# Patient Record
Sex: Male | Born: 2000
Health system: Southern US, Community
[De-identification: ages and names within clinical notes are randomized; demographics above are authoritative.]

## PROBLEM LIST (undated history)

## (undated) DIAGNOSIS — T7840XA Allergy, unspecified, initial encounter: Secondary | ICD-10-CM

## (undated) DIAGNOSIS — F418 Other specified anxiety disorders: Secondary | ICD-10-CM

## (undated) HISTORY — DX: Other specified anxiety disorders: F41.8

## (undated) HISTORY — DX: Allergy, unspecified, initial encounter: T78.40XA

---
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
## 2018-01-01 ENCOUNTER — Ambulatory Visit (INDEPENDENT_AMBULATORY_CARE_PROVIDER_SITE_OTHER): Payer: 59 | Admitting: Pediatrics

## 2018-01-01 DIAGNOSIS — Z23 Encounter for immunization: Secondary | ICD-10-CM | POA: Diagnosis not present

## 2018-01-01 NOTE — Progress Notes (Signed)
MenB per orders. Indications, contraindications and side effects of vaccine/vaccines discussed with parent and parent verbally expressed understanding and also agreed with the administration of vaccine/vaccines as ordered above today.Handout (VIS) given for each vaccine at this visit.  

## 2018-10-31 ENCOUNTER — Other Ambulatory Visit: Payer: Self-pay

## 2018-10-31 ENCOUNTER — Ambulatory Visit (INDEPENDENT_AMBULATORY_CARE_PROVIDER_SITE_OTHER): Payer: 59 | Admitting: Pediatrics

## 2018-10-31 ENCOUNTER — Encounter: Payer: Self-pay | Admitting: Pediatrics

## 2018-10-31 VITALS — BP 110/74 | Ht 68.0 in | Wt 140.8 lb

## 2018-10-31 DIAGNOSIS — Z68.41 Body mass index (BMI) pediatric, 5th percentile to less than 85th percentile for age: Secondary | ICD-10-CM

## 2018-10-31 DIAGNOSIS — L708 Other acne: Secondary | ICD-10-CM

## 2018-10-31 DIAGNOSIS — Z0001 Encounter for general adult medical examination with abnormal findings: Secondary | ICD-10-CM | POA: Diagnosis not present

## 2018-10-31 DIAGNOSIS — Z00129 Encounter for routine child health examination without abnormal findings: Secondary | ICD-10-CM

## 2018-10-31 MED ORDER — TRIAMCINOLONE ACETONIDE 0.025 % EX OINT: 1.0000 "application " | TOPICAL_OINTMENT | Freq: Two times a day (BID) | CUTANEOUS | 6 refills | Status: AC

## 2018-10-31 MED ORDER — CLINDAMYCIN PHOSPHATE 1 % EX GEL: Freq: Two times a day (BID) | CUTANEOUS | 12 refills | Status: AC

## 2018-10-31 MED FILL — CLINDAMYCIN PH 1% GEL: 1 | 21 days supply | Qty: 30 | Fill #0

## 2018-10-31 MED FILL — TRIAMCINOLONE 0.025% OINT: 0.025 | 14 days supply | Qty: 30 | Fill #0

## 2018-10-31 NOTE — Patient Instructions (Signed)

## 2018-10-31 NOTE — Progress Notes (Signed)
  Adolescent Well Care Visit Kevin Lane is a 18 y.o. male who is here for well care.    PCP:  Marcha Solders, MD   History was provided by the patient.  Current Issues: Acne to face and back Hand eczema  Nutrition: Nutrition/Eating Behaviors: good Adequate calcium in diet?: yes Supplements/ Vitamins: yes  Exercise/ Media: Play any Sports?/ Exercise: yes Screen Time:  < 2 hours Media Rules or Monitoring?: yes  Sleep:  Sleep: 8-10 hours  Social Screening: Lives with:  parents Parental relations:  good Activities, Work, and Research officer, political party?: yes Concerns regarding behavior with peers?  no Stressors of note: no  Education:  School GradeLoss adjuster, chartered: doing well; no concerns School Behavior: doing well; no concerns  Menstruation:   No LMP for male patient.    Tobacco?  no Secondhand smoke exposure?  no Drugs/ETOH?  no  Sexually Active?  no     Safe at home, in school & in relationships?  Yes Safe to self?  Yes   Screenings: Patient has a dental home: yes  The patient completed the Rapid Assessment for Adolescent Preventive Services screening questionnaire and the following topics were identified as risk factors and discussed: healthy eating, exercise, seatbelt use, bullying, abuse/trauma, weapon use, tobacco use, marijuana use, drug use, condom use, birth control, sexuality, suicidality/self harm, mental health issues, social isolation, school problems, family problems and screen time    PHQ-9 completed and results indicated --no risk  Physical Exam:  Vitals:   10/31/18 0926  BP: 110/74  Weight: 140 lb 12.8 oz (63.9 kg)  Height: 5\' 8"  (1.727 m)   BP 110/74   Ht 5\' 8"  (1.727 m)   Wt 140 lb 12.8 oz (63.9 kg)   BMI 21.41 kg/m  Body mass index: body mass index is 21.41 kg/m. Blood pressure percentiles are not available for patients who are 18 years or older.   Hearing Screening   125Hz  250Hz  500Hz  1000Hz  2000Hz  3000Hz  4000Hz  6000Hz   8000Hz   Right ear:   20 20 20 20 20     Left ear:   20 20 20 20 20       Visual Acuity Screening   Right eye Left eye Both eyes  Without correction: 10/10 10/10   With correction:       General Appearance:   alert, oriented, no acute distress and well nourished  HENT: Normocephalic, no obvious abnormality, conjunctiva clear  Mouth:   Normal appearing teeth, no obvious discoloration, dental caries, or dental caps  Neck:   Supple; thyroid: no enlargement, symmetric, no tenderness/mass/nodules  Chest normal  Lungs:   Clear to auscultation bilaterally, normal work of breathing  Heart:   Regular rate and rhythm, S1 and S2 normal, no murmurs;   Abdomen:   Soft, non-tender, no mass, or organomegaly  GU normal male genitals, no testicular masses or hernia  Musculoskeletal:   Tone and strength strong and symmetrical, all extremities               Lymphatic:   No cervical adenopathy  Skin/Hair/Nails:   Skin warm, dry and intact, no rashes, no bruises or petechiae  Neurologic:   Strength, gait, and coordination normal and age-appropriate     Assessment and Plan:   Annual well check  Acne Eczema  BMI is appropriate for age  Hearing screening result:normal Vision screening result: normal    Return in about 1 year (around 10/31/2019).Marcha Solders, MD

## 2018-12-11 MED FILL — CLINDAMYCIN PH 1% GEL: 1 | 21 days supply | Qty: 30 | Fill #1

## 2018-12-23 ENCOUNTER — Ambulatory Visit (INDEPENDENT_AMBULATORY_CARE_PROVIDER_SITE_OTHER): Payer: 59 | Admitting: Sports Medicine

## 2018-12-23 ENCOUNTER — Other Ambulatory Visit: Payer: Self-pay

## 2018-12-23 DIAGNOSIS — G5621 Lesion of ulnar nerve, right upper limb: Secondary | ICD-10-CM | POA: Diagnosis not present

## 2018-12-23 DIAGNOSIS — G5620 Lesion of ulnar nerve, unspecified upper limb: Secondary | ICD-10-CM | POA: Insufficient documentation

## 2018-12-23 MED ORDER — MELOXICAM 15 MG PO TABS: ORAL_TABLET | ORAL | 0 refills | Status: DC

## 2018-12-23 MED FILL — MELOXICAM 15 MG TABLET: 15 | 40 days supply | Qty: 40 | Fill #0

## 2018-12-23 NOTE — Progress Notes (Signed)
     Subjective:  HPI: Kevin Lane is a 18 y.o. presenting to clinic today to discuss the following:  Bilateral Wrist Pain Patient is a 18y/o male with PMH of elbow pain presenting today for bilateral wrist pain that began about 9 weeks ago. 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 It started in his right hand and then also his left hand when he started using that more. It is better when he is not using the keyboard and/or mouse or drawing. It is also worse if he lays on it at night. He does not have any numbness or loss of sensation. He denies fever, chills, rash, swelling of the hands, or any discoloration. No falls or recent trauma to the arms or wrist.     ROS noted in HPI.    Social History   Tobacco Use  Smoking Status Never Smoker  Smokeless Tobacco Never Used    Objective: BP 102/78   Ht 5\' 8"  (1.727 m)   Wt 140 lb (63.5 kg)   BMI 21.29 kg/m  Vitals and nursing notes reviewed  Physical Exam Wrist, Bilateral: TTP noted diffusely in both hands and at the wrist joint. Inspection yielded no erythema, ecchymosis, bony deformity, or swelling. ROM full with good flexion and extension and ulnar/radial deviation that is symmetrical with opposite wrist. Palpation is normal over metacarpals, scaphoid, lunate, and TFCC although he does have mild diffuse tenderness in his hands; tendons without tenderness/swelling. Strength 5/5 in all directions without pain. Negative Finkelstein, tinel's and phalens.  Elbow, Bilateral:  Inspection yielded no erythema, ecchymosis, bony deformity, or swelling. ROM full with good flexion and extension that is symmetrical with opposite elbow. Palpation yielded no tenderness. Strength 5/5 in flexion and extension. Positive tinel sign at the cubital tunnel on the right, negative on the left.  Assessment/Plan:  Irritation of  right ulnar nerve Most likely given history and symptoms patient has ulnar nerve irritation bilaterally from overuse. More on the right than the left which makes sense given he is right handed and uses it more. It has responded to some mild activity modification. - Meloxicam 15mg  once daily for 7 days, then prn - Hand PT - Can continue bracing during activities that are known to cause pain/discomfort - F/u in 6 weeks if no improvement with PT and consider getting EMG   PATIENT EDUCATION PROVIDED: See AVS    Diagnosis and plan along with any newly prescribed medication(s) were discussed in detail with this patient today. The patient verbalized understanding and agreed with the plan. Patient advised if symptoms worsen return to clinic or ER.    Meds ordered this encounter  Medications  . meloxicam (MOBIC) 15 MG tablet    Sig: Take 1 tablet daily with food for 7 days. Then take as needed.    Dispense:  40 tablet    Refill:  0     Tim Garlan Fillers, DO 12/23/2018, 8:30 AM PGY-3 [facility]  Patient seen and evaluated with the resident.  I agree with the above plan of care.  Although not straightforward, patient's symptoms sound suspicious for bilateral ulnar neuritis.  He will try short course of anti-inflammatory medicine and start hand physical therapy.  Follow-up with me again in 6 weeks for reevaluation.  We could consider EMG/nerve conduction studies at that time if symptoms worsen.

## 2018-12-23 NOTE — Assessment & Plan Note (Deleted)
Most likely given history and symptoms patient has ulnar nerve entrapement/irritation bilaterally from overuse. More on the right than the left which makes sense given he is right handed and uses it more. It has responded to some mild activity modification. - Meloxicam 15mg  once daily for 7 days, then prn - Hand PT with [facility] - Can continue bracing during activities that are known to cause pain/discomfort - F/u in 6 weeks if no improvement with PT and consider getting EMG

## 2018-12-23 NOTE — Assessment & Plan Note (Signed)
Most likely given history and symptoms patient has ulnar nerve irritation bilaterally from overuse. More on the right than the left which makes sense given he is right handed and uses it more. It has responded to some mild activity modification. - Meloxicam 15mg  once daily for 7 days, then prn - Hand PT - Can continue bracing during activities that are known to cause pain/discomfort - F/u in 6 weeks if no improvement with PT and consider getting EMG

## 2018-12-23 NOTE — Patient Instructions (Signed)
Cubital Tunnel Syndrome  Cubital tunnel syndrome is a condition that causes pain and weakness of the forearm and hand. It happens when one of the nerves that runs along the inside of the elbow joint (ulnar nerve) becomes irritated. This condition is usually caused by repeated arm motions that are done during sports or work-related activities. What are the causes? This condition may be caused by:  Increased pressure on the ulnar nerve at the elbow, arm, or forearm. This can result from: ? Irritation caused by repeated elbow bending. ? Poorly healed elbow fractures. ? Tumors in the elbow. These are usually noncancerous (benign). ? Scar tissue that develops in the elbow after an injury. ? Bony growths (spurs) near the ulnar nerve.  Stretching of the nerve due to loose elbow ligaments.  Trauma to the nerve at the elbow. What increases the risk? The following factors may make you more likely to develop this condition:  Doing manual labor that requires frequent bending of the elbow.  Playing sports that include repeated or strenuous throwing motions, such as baseball.  Playing contact sports, such as football or lacrosse.  Not warming up properly before activities.  Having diabetes.  Having an underactive thyroid (hypothyroidism). What are the signs or symptoms? Symptoms of this condition include:  Clumsiness or weakness of the hand.  Tenderness of the inner elbow.  Aching or soreness of the inner elbow, forearm, or fingers, especially the little finger or the ring finger.  Increased pain when forcing the elbow to bend.  Reduced control when throwing objects.  Tingling, numbness, or a burning feeling inside the forearm or in part of the hand or fingers, especially the little finger or the ring finger.  Sharp pains that shoot from the elbow down to the wrist and hand.  The inability to grip or pinch hard. How is this diagnosed? This condition is diagnosed based on:  Your  symptoms and medical history. Your health care provider will also ask for details about any injury.  A physical exam. You may also have tests, including:  Electromyogram (EMG). This test measures electrical signals sent by your nerves into the muscles.  Nerve conduction study. This test measures how well electrical signals pass through your nerves.  Imaging tests, such as X-rays, ultrasound, and MRI. These tests check for possible causes of your condition. How is this treated? This condition may be treated by:  Stopping the activities that are causing your symptoms to get worse.  Icing and taking medicines to reduce pain and swelling.  Wearing a splint to prevent your elbow from bending, or wearing an elbow pad where the ulnar nerve is closest to the skin.  Working with a physical therapist in less severe cases. This may help to: ? Decrease your symptoms. ? Improve the strength and range of motion of your elbow, forearm, and hand. If these treatments do not help, surgery may be needed. Follow these instructions at home: If you have a splint:  Wear the splint as told by your health care provider. Remove it only as told by your health care provider.  Loosen the splint if your fingers tingle, become numb, or turn cold and blue.  Keep the splint clean.  If the splint is not waterproof: ? Do not let it get wet. ? Cover it with a watertight covering when you take a bath or shower. Managing pain, stiffness, and swelling   If directed, put ice on the injured area: ? Put ice in a plastic bag. ?   Place a towel between your skin and the bag. ? Leave the ice on for 20 minutes, 2-3 times a day.  Move your fingers often to avoid stiffness and to lessen swelling.  Raise (elevate) the injured area above the level of your heart while you are sitting or lying down. General instructions  Take over-the-counter and prescription medicines only as told by your health care provider.  Do any  exercise or physical therapy as told by your health care provider.  Do not drive or use heavy machinery while taking prescription pain medicine.  If you were given an elbow pad, wear it as told by your health care provider.  Keep all follow-up visits as told by your health care provider. This is important. Contact a health care provider if:  Your symptoms get worse.  Your symptoms do not get better with treatment.  You have new pain.  Your hand on the injured side feels numb or cold. Summary  Cubital tunnel syndrome is a condition that causes pain and weakness of the forearm and hand.  You are more likely to develop this condition if you do work or play sports that involve repeated arm movements.  This condition is often treated by stopping repetitive activities, applying ice, and using anti-inflammatory medicines.  In rare cases, surgery may be needed. This information is not intended to replace advice given to you by your health care provider. Make sure you discuss any questions you have with your health care provider. Document Released: 03/06/2005 Document Revised: 07/23/2017 Document Reviewed: 07/23/2017 Elsevier Patient Education  2020 [employer]  

## 2019-01-01 DIAGNOSIS — M6281 Muscle weakness (generalized): Secondary | ICD-10-CM | POA: Diagnosis not present

## 2019-01-01 DIAGNOSIS — G5623 Lesion of ulnar nerve, bilateral upper limbs: Secondary | ICD-10-CM | POA: Diagnosis not present

## 2019-01-01 DIAGNOSIS — M25549 Pain in joints of unspecified hand: Secondary | ICD-10-CM | POA: Diagnosis not present

## 2019-01-01 DIAGNOSIS — M25529 Pain in unspecified elbow: Secondary | ICD-10-CM | POA: Diagnosis not present

## 2019-01-01 DIAGNOSIS — M25619 Stiffness of unspecified shoulder, not elsewhere classified: Secondary | ICD-10-CM | POA: Diagnosis not present

## 2019-01-10 DIAGNOSIS — M25549 Pain in joints of unspecified hand: Secondary | ICD-10-CM | POA: Diagnosis not present

## 2019-01-10 DIAGNOSIS — M25619 Stiffness of unspecified shoulder, not elsewhere classified: Secondary | ICD-10-CM | POA: Diagnosis not present

## 2019-01-10 DIAGNOSIS — M25529 Pain in unspecified elbow: Secondary | ICD-10-CM | POA: Diagnosis not present

## 2019-01-10 DIAGNOSIS — M6281 Muscle weakness (generalized): Secondary | ICD-10-CM | POA: Diagnosis not present

## 2019-01-10 DIAGNOSIS — G5623 Lesion of ulnar nerve, bilateral upper limbs: Secondary | ICD-10-CM | POA: Diagnosis not present

## 2019-01-13 DIAGNOSIS — M25619 Stiffness of unspecified shoulder, not elsewhere classified: Secondary | ICD-10-CM | POA: Diagnosis not present

## 2019-01-13 DIAGNOSIS — M25529 Pain in unspecified elbow: Secondary | ICD-10-CM | POA: Diagnosis not present

## 2019-01-13 DIAGNOSIS — M6281 Muscle weakness (generalized): Secondary | ICD-10-CM | POA: Diagnosis not present

## 2019-01-13 DIAGNOSIS — M25549 Pain in joints of unspecified hand: Secondary | ICD-10-CM | POA: Diagnosis not present

## 2019-01-13 DIAGNOSIS — G5623 Lesion of ulnar nerve, bilateral upper limbs: Secondary | ICD-10-CM | POA: Diagnosis not present

## 2019-01-15 DIAGNOSIS — M6281 Muscle weakness (generalized): Secondary | ICD-10-CM | POA: Diagnosis not present

## 2019-01-15 DIAGNOSIS — M25549 Pain in joints of unspecified hand: Secondary | ICD-10-CM | POA: Diagnosis not present

## 2019-01-15 DIAGNOSIS — M25619 Stiffness of unspecified shoulder, not elsewhere classified: Secondary | ICD-10-CM | POA: Diagnosis not present

## 2019-01-15 DIAGNOSIS — M25529 Pain in unspecified elbow: Secondary | ICD-10-CM | POA: Diagnosis not present

## 2019-01-15 DIAGNOSIS — G5623 Lesion of ulnar nerve, bilateral upper limbs: Secondary | ICD-10-CM | POA: Diagnosis not present

## 2019-01-20 DIAGNOSIS — G5623 Lesion of ulnar nerve, bilateral upper limbs: Secondary | ICD-10-CM | POA: Diagnosis not present

## 2019-01-20 DIAGNOSIS — M6281 Muscle weakness (generalized): Secondary | ICD-10-CM | POA: Diagnosis not present

## 2019-01-20 DIAGNOSIS — M25529 Pain in unspecified elbow: Secondary | ICD-10-CM | POA: Diagnosis not present

## 2019-01-20 DIAGNOSIS — M25619 Stiffness of unspecified shoulder, not elsewhere classified: Secondary | ICD-10-CM | POA: Diagnosis not present

## 2019-01-20 DIAGNOSIS — M25549 Pain in joints of unspecified hand: Secondary | ICD-10-CM | POA: Diagnosis not present

## 2019-01-22 DIAGNOSIS — G5623 Lesion of ulnar nerve, bilateral upper limbs: Secondary | ICD-10-CM | POA: Diagnosis not present

## 2019-01-22 DIAGNOSIS — M25619 Stiffness of unspecified shoulder, not elsewhere classified: Secondary | ICD-10-CM | POA: Diagnosis not present

## 2019-01-22 DIAGNOSIS — M6281 Muscle weakness (generalized): Secondary | ICD-10-CM | POA: Diagnosis not present

## 2019-01-22 DIAGNOSIS — M25549 Pain in joints of unspecified hand: Secondary | ICD-10-CM | POA: Diagnosis not present

## 2019-01-22 DIAGNOSIS — M25529 Pain in unspecified elbow: Secondary | ICD-10-CM | POA: Diagnosis not present

## 2019-01-27 DIAGNOSIS — M25619 Stiffness of unspecified shoulder, not elsewhere classified: Secondary | ICD-10-CM | POA: Diagnosis not present

## 2019-01-27 DIAGNOSIS — M25529 Pain in unspecified elbow: Secondary | ICD-10-CM | POA: Diagnosis not present

## 2019-01-27 DIAGNOSIS — G5623 Lesion of ulnar nerve, bilateral upper limbs: Secondary | ICD-10-CM | POA: Diagnosis not present

## 2019-01-27 DIAGNOSIS — M6281 Muscle weakness (generalized): Secondary | ICD-10-CM | POA: Diagnosis not present

## 2019-01-27 DIAGNOSIS — M25549 Pain in joints of unspecified hand: Secondary | ICD-10-CM | POA: Diagnosis not present

## 2019-01-29 ENCOUNTER — Other Ambulatory Visit: Payer: Self-pay

## 2019-01-29 ENCOUNTER — Ambulatory Visit (INDEPENDENT_AMBULATORY_CARE_PROVIDER_SITE_OTHER): Payer: 59 | Admitting: Sports Medicine

## 2019-01-29 VITALS — BP 102/78 | Ht 68.0 in | Wt 140.0 lb

## 2019-01-29 DIAGNOSIS — G5621 Lesion of ulnar nerve, right upper limb: Secondary | ICD-10-CM | POA: Diagnosis not present

## 2019-01-29 NOTE — Progress Notes (Signed)
   Subjective:    Patient ID: Kevin Lane, male    DOB: 10/31/2000, 18 y.o.   MRN: [id number]  HPI   Patient comes in today for follow-up on ulnar neuritis in both arms, right greater than left.  While working with physical therapy the physical therapist noticed that his right ulnar nerve was subluxing from the cubital tunnel.  Patient is able to voluntarily sublux and dislocate his ulnar nerve which is quite uncomfortable.  He continues to endorse discomfort along the ulnar nerve distribution of his forearm and hand but he is also getting some more diffuse pain throughout the hand and wrist as well.    Review of Systems     Objective:   Physical Exam Well-developed, well-nourished.  No acute distress.  Awake alert and oriented x3.  Vital signs reviewed  Examination of the right elbow shows full range of motion with no effusion.  No soft tissue swelling.  There is palpable and visible evidence of ulnar nerve subluxation at the cubital tunnel.  Examination of the left elbow shows full range of motion but I cannot appreciate any voluntary dislocation on this side. I attempted a dynamic ultrasound of the ulnar nerve but it was difficult to capture the ulnar nerve in motion.       Assessment & Plan:   Bilateral ulnar neuritis, right greater than left, secondary to subluxing/dislocating ulnar nerves at the cubital tunnel  I would like for the patient to see Dr. Ophelia Charter.  I am going to defer any further work-up such as an MRI to his discretion.  My suspicion is that the patient will ultimately need an ulnar nerve transposition.  I will defer further treatment to the discretion of Dr. Griffin Basil and the patient will follow up with me as needed.

## 2019-01-29 NOTE — Patient Instructions (Addendum)
[facility] Dr Amedeo Plenty 03/06/19 [address] [place] [phone].

## 2019-01-30 MED FILL — CLINDAMYCIN PH 1% GEL: 1 | 21 days supply | Qty: 30 | Fill #2

## 2019-02-07 ENCOUNTER — Encounter: Payer: Self-pay | Admitting: Sports Medicine

## 2019-02-07 ENCOUNTER — Ambulatory Visit (INDEPENDENT_AMBULATORY_CARE_PROVIDER_SITE_OTHER): Payer: 59 | Admitting: Sports Medicine

## 2019-02-07 ENCOUNTER — Other Ambulatory Visit: Payer: Self-pay

## 2019-02-07 VITALS — BP 104/72 | Ht 68.0 in | Wt 140.0 lb

## 2019-02-07 DIAGNOSIS — M79672 Pain in left foot: Secondary | ICD-10-CM

## 2019-02-07 NOTE — Patient Instructions (Signed)
Is a small amount of fluid around the muscles that extend your toes Keep using the meloxicam daily Make sure your sneakers are not tight or too tight You can use vitamin B6 50 mg a day We have given you exercises to implement daily Follow-up with me in 1-3 weeks depending how you are feeling

## 2019-02-07 NOTE — Progress Notes (Addendum)
[facility] [address] [place], [place] [address] Phone: [phone] Fax: [phone]   Patient Name: Kevin Lane Date of Birth: 2000/09/24 Medical Record Number: [id number] Gender: male Date of Encounter: 02/07/2019  CC: Left foot pain  HPI: Patient is 18 year old male complaining of pain and tingling on the top of his left foot.  It started 1 week ago after going for a walk, and since that time any pressure such as a sock or shoe directly touching the top of his foot causes discomfort.  He is having to wear flip-flops.  Even the water hitting him in the shower is uncomfortable.  He denies any kind of injury to the ankle or foot.  He has never injured this ankle or foot before.  His mother is in the room and thinks it was a little more swollen compared to the other foot.  He denies radiating pain.  He has been using the same pair of shoes for 6 months.  Past Medical History:  Diagnosis Date  . Allergy   . Depression with anxiety April 2015    Current Outpatient Medications on File Prior to Visit  Medication Sig Dispense Refill  . clindamycin (CLINDAGEL) 1 % gel     . meloxicam (MOBIC) 15 MG tablet Take 1 tablet daily with food for 7 days. Then take as needed. 40 tablet 0  . EPIDUO FORTE 0.3-2.5 % GEL APPLY TO ACNE EVERY NIGHT AT BEDTIME AS DIRECTED  3  . minocycline (MINOCIN,DYNACIN) 50 MG capsule Take 50 mg by mouth 2 (two) times daily.  1   No current facility-administered medications on file prior to visit.     No past surgical history on file.  No Known Allergies  Social History   Socioeconomic History  . Marital status: Single    Spouse name: Not on file  . Number of children: Not on file  . Years of education: Not on file  . Highest education level: Not on file  Occupational History  . Not on file  Social Needs  . Financial resource strain: Not on file  . Food insecurity    Worry: Not on file    Inability: Not on  file  . Transportation needs    Medical: Not on file    Non-medical: Not on file  Tobacco Use  . Smoking status: Never Smoker  . Smokeless tobacco: Never Used  Substance and Sexual Activity  . Alcohol use: No  . Drug use: No  . Sexual activity: Never  Lifestyle  . Physical activity    Days per week: Not on file    Minutes per session: Not on file  . Stress: Not on file  Relationships  . Social Herbalist on phone: Not on file    Gets together: Not on file    Attends religious service: Not on file    Active member of club or organization: Not on file    Attends meetings of clubs or organizations: Not on file    Relationship status: Not on file  . Intimate partner violence    Fear of current or ex partner: Not on file    Emotionally abused: Not on file    Physically abused: Not on file    Forced sexual activity: Not on file  Other Topics Concern  . Not on file  Social History Narrative  . Not on file    No family history on file.  BP 104/72  Ht 5\' 8"  (1.727 m)   Wt 140 lb (63.5 kg)   BMI 21.29 kg/m   ROS:  See HPI CONST: no F/C, no malaise, no fatigue MSK: See above NEURO:  no weakness SKIN: no rash, no lesions HEME: no bleeding, no bruising, no erythema  Objective: GEN: Alert and oriented, NAD Pulm: Breathing unlabored PSY: normal mood, congruent affect  Left ankle No gross deformity, swelling, ecchymoses FROM Mild TTP anterior talus Negative ant drawer and talar tilt.   Negative syndesmotic compression. Thompsons test negative. NV intact distally.  Right ankle No gross deformity, swelling, ecchymoses FROM No TTP Negative ant drawer and talar tilt.   Negative syndesmotic compression. Thompsons test negative. NV intact distally.  Limited MSK ultrasound Left ankle Tibialis anterior tendon was visualized in long and short axis without abnormality.  Talar dome was visualized without any cortical irregularity or neovascularization.   Small amount of hypoechoic changes along extensor tendons of the foot.  Impression: Tendinitis of extensor tendons  Assessment and Plan:  1.  Left foot pain  Overall reassuring physical exam.  Recommended to loosen tightening of his laces.  He was given exercises for general tibialis anterior rehab.  Trial of vitamin B6.  Continue to use meloxicam.  He will follow-up in 1-3 weeks.   , DO, [facility] Sports Medicine Fellow  Addendum:  Patient seen in the office by fellow.  His history, exam, plan of care were precepted with me.  Judge Stall MD [facility]

## 2019-03-06 ENCOUNTER — Other Ambulatory Visit: Payer: Self-pay | Admitting: Sports Medicine

## 2019-03-06 DIAGNOSIS — G5623 Lesion of ulnar nerve, bilateral upper limbs: Secondary | ICD-10-CM | POA: Diagnosis not present

## 2019-03-06 DIAGNOSIS — M25522 Pain in left elbow: Secondary | ICD-10-CM | POA: Diagnosis not present

## 2019-03-06 DIAGNOSIS — M25521 Pain in right elbow: Secondary | ICD-10-CM | POA: Diagnosis not present

## 2019-03-06 MED FILL — METHYLPREDNISOLONE 4 MG TAB: 4 | 6 days supply | Qty: 21 | Fill #0

## 2019-03-06 MED FILL — CLINDAMYCIN PH 1% GEL: 1 | 21 days supply | Qty: 30 | Fill #3

## 2019-03-22 IMAGING — DX DG KNEE 3 VIEWS*L*
3 series · 3 of 3 positions shown · non-contrast
Comparison: None.

CLINICAL DATA: Chronic left knee pain with no injury.

EXAM:
LEFT KNEE - 3 VIEW

[dg knee 3 views left (1 of 3)]
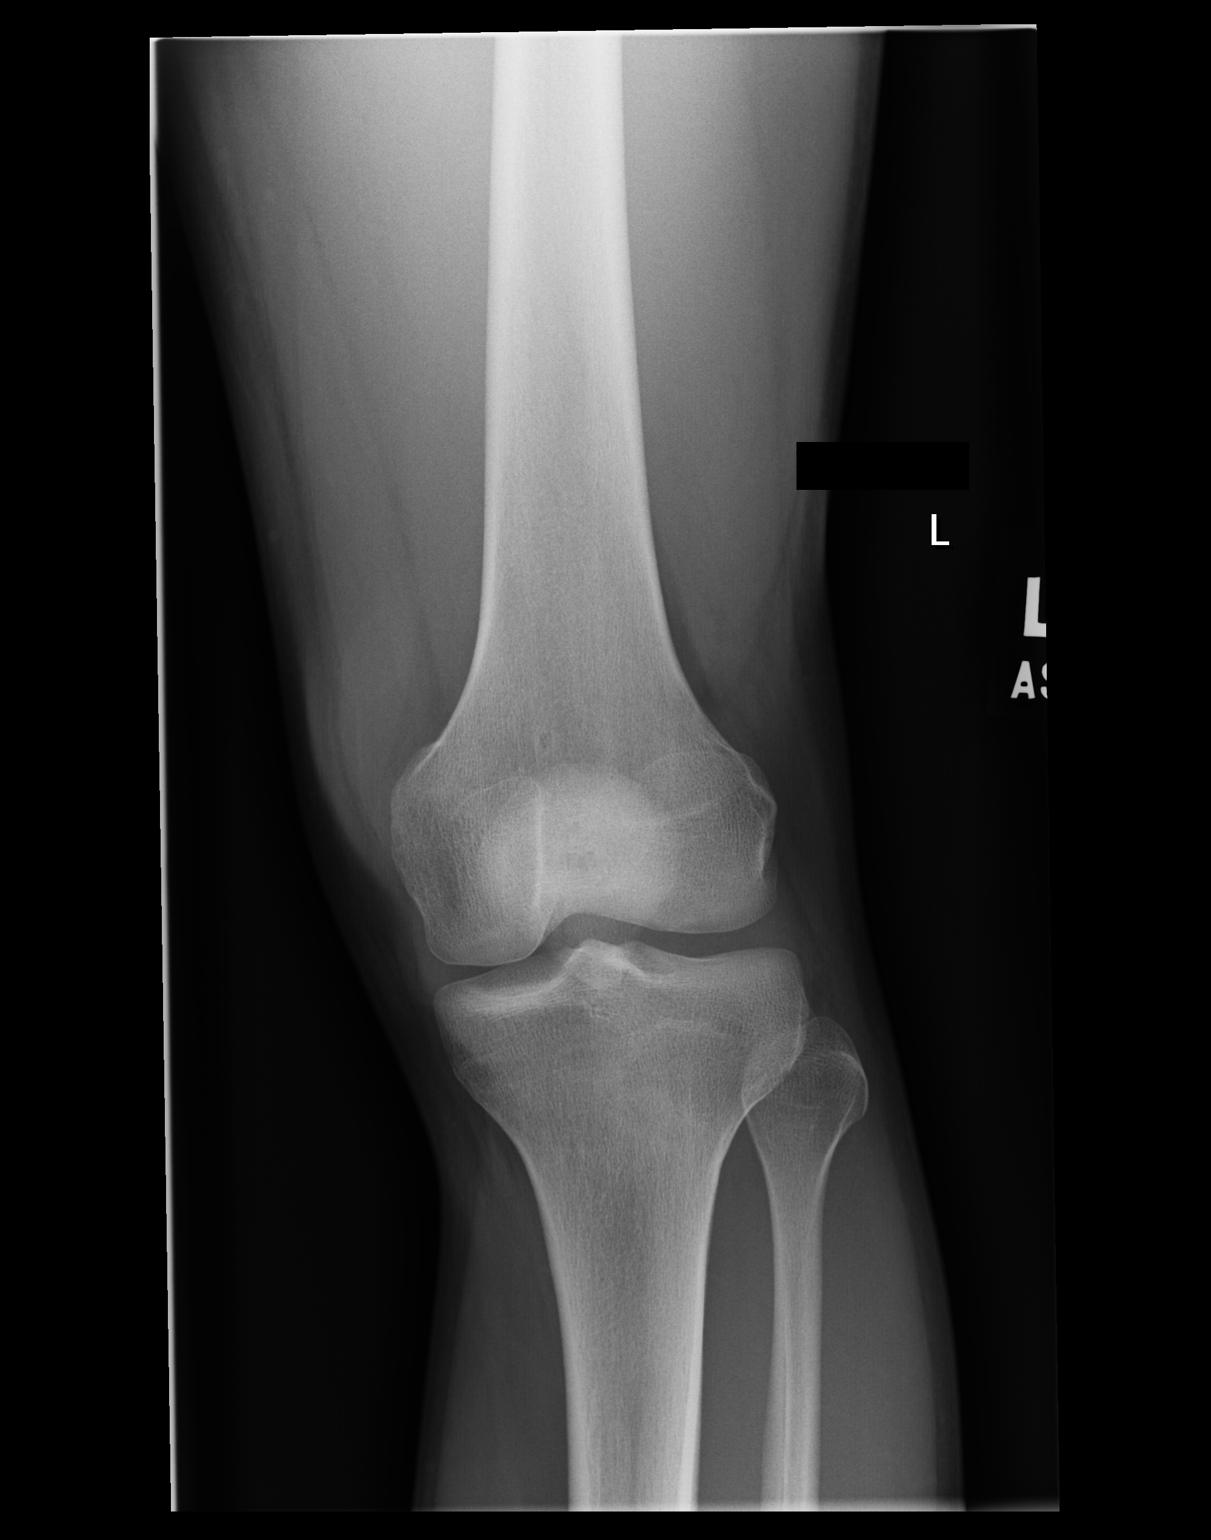

[dg knee 3 views left (2 of 3)]
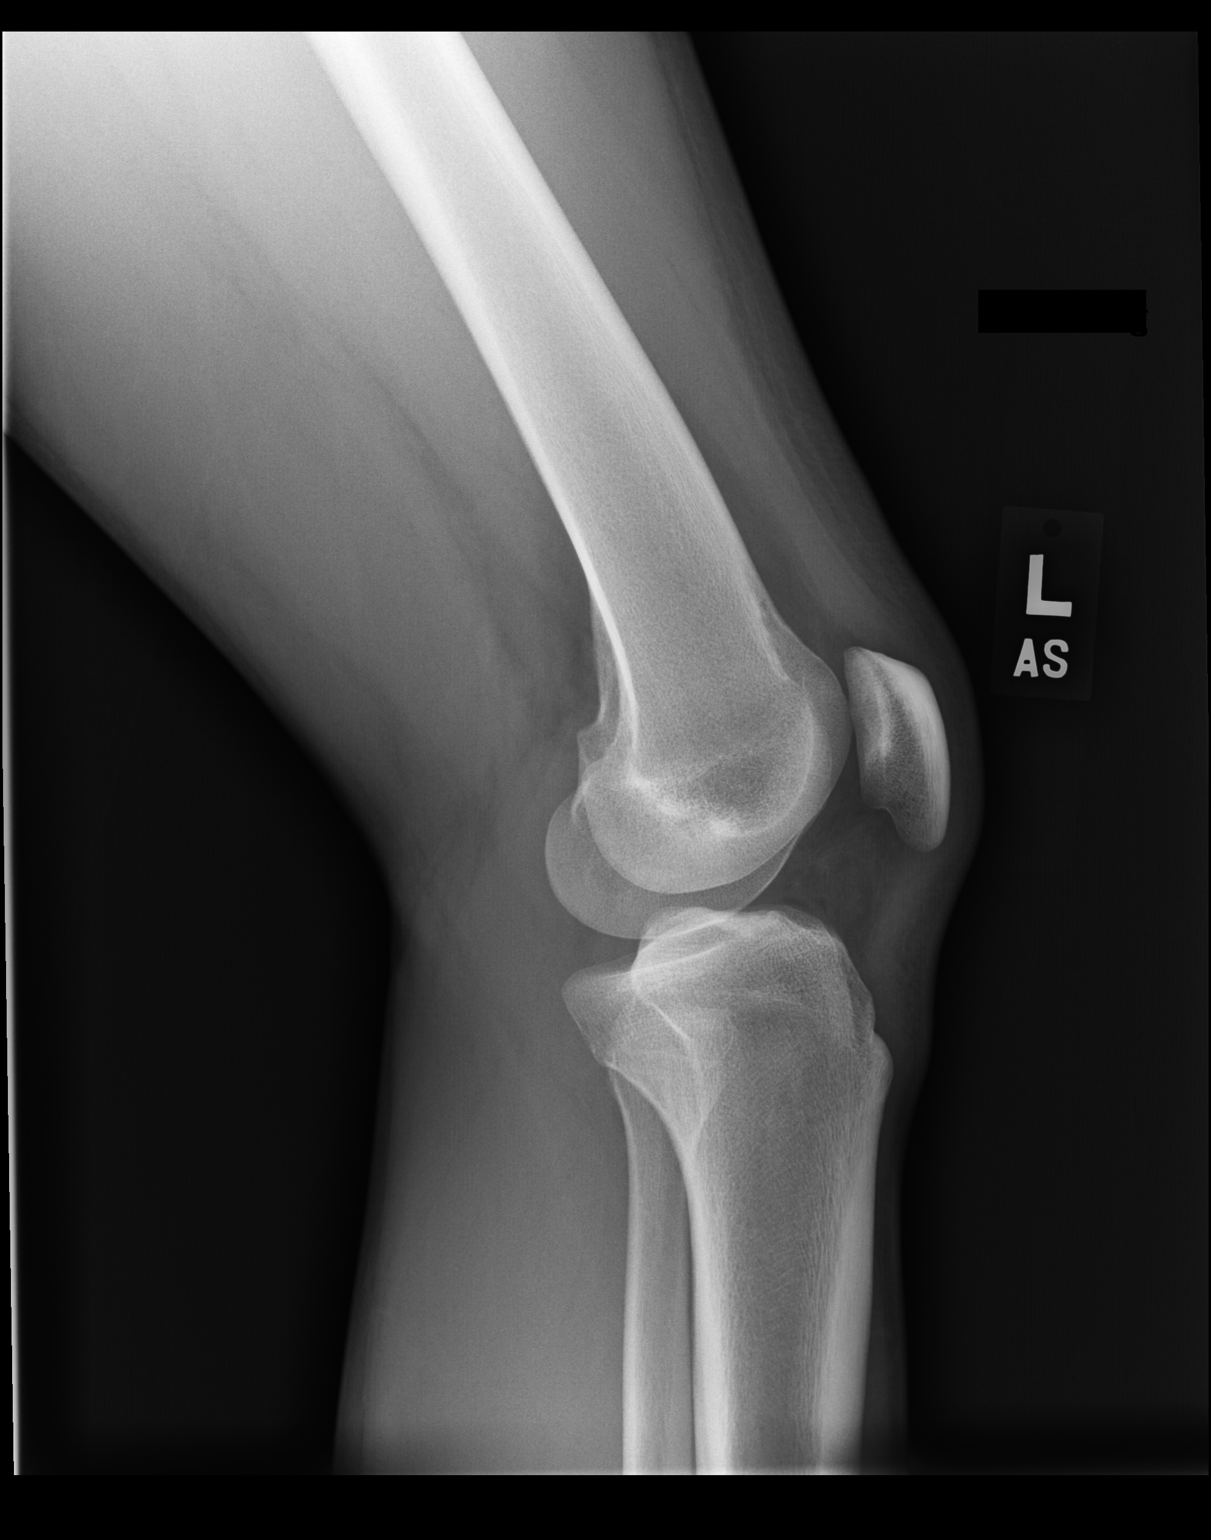

[dg knee 3 views left (3 of 3)]
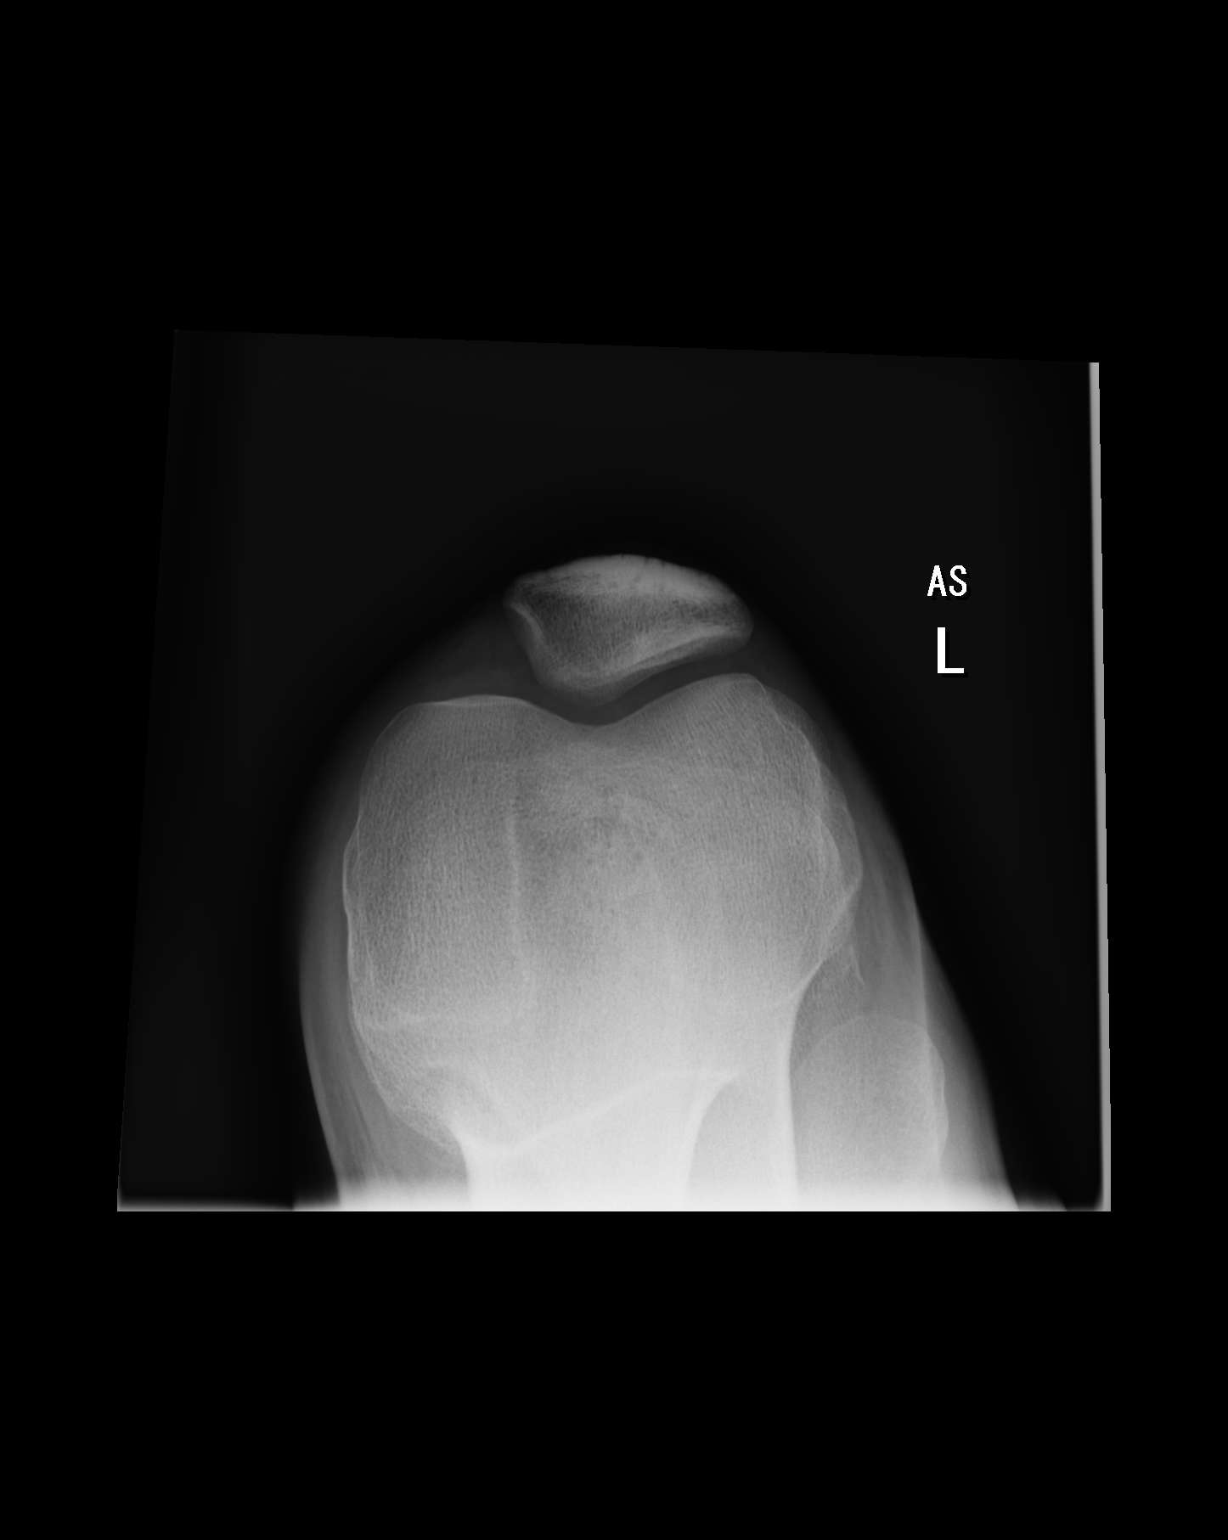

[3 of 3 positions shown; findings below may reference images not displayed]

FINDINGS: No evidence of fracture, dislocation, or joint effusion. No evidence
of arthropathy or other focal bone abnormality. Soft tissues are
unremarkable.
IMPRESSION: Negative.

## 2019-04-11 ENCOUNTER — Other Ambulatory Visit: Payer: Self-pay

## 2019-04-11 ENCOUNTER — Ambulatory Visit (HOSPITAL_COMMUNITY)
Admission: EM | Admit: 2019-04-11 | Discharge: 2019-04-11 | Disposition: A | Discharge status: Home / Self Care | Payer: 59 | Attending: Family Medicine | Admitting: Family Medicine

## 2019-04-11 ENCOUNTER — Encounter (HOSPITAL_COMMUNITY): Payer: Self-pay

## 2019-04-11 DIAGNOSIS — Z20828 Contact with and (suspected) exposure to other viral communicable diseases: Secondary | ICD-10-CM | POA: Diagnosis not present

## 2019-04-11 DIAGNOSIS — J029 Acute pharyngitis, unspecified: Secondary | ICD-10-CM | POA: Insufficient documentation

## 2019-04-11 LAB — POCT RAPID STREP A: Streptococcus, Group A Screen (Direct): NEGATIVE

## 2019-04-11 NOTE — Discharge Instructions (Addendum)
The strep test is negative Culture is pending This is likely a sore throat caused by a virus Covid testing is also pending You need to stay home and quarantine until your test result is available You may take Tylenol alternating with ibuprofen to reduce pain and fever You may try Chloraseptic spray or lozenges to reduce throat pain Salt water gargles are often helpful You can get your test results on MyChart

## 2019-04-11 NOTE — ED Triage Notes (Addendum)
Patient presents to Urgent Care with complaints of sore throat since two days ago, states swallowing feels sharp. Patient reports he also has been feeling achy.  Pt got a covid test pta, test pending.

## 2019-04-11 NOTE — ED Provider Notes (Signed)
[facility]    CSN: [id number] Arrival date & time: 04/11/19  1706      History   Chief Complaint Chief Complaint  Patient presents with  . Sore Throat    HPI Kevin Lane is a 19 y.o. male.   HPI  Patient has a severely painful throat for the last 3 days.  No fever chills.  No body aches.  No headache.  No loss of taste or smell.  Diminished appetite.  Not eating or drinking as much secondary to pain.  He is treating this with cough drops.  He got a Covid test this morning.  Will be available in 3 to 4 days.  Was done at [facility].  No known exposure to Covid.  Lives at home with family.   Past Medical History:  Diagnosis Date  . Allergy   . Depression with anxiety April 2015    Patient Active Problem List   Diagnosis Date Noted  . 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 Encounter for routine child health examination without abnormal findings 10/24/2016  . BMI (body mass index), pediatric, 5% to less than 85% for age 87/09/2013  . Depression with anxiety 06/24/2013  . Inflammatory acne 06/24/2013    History reviewed. No pertinent surgical history.     Home Medications    Prior to Admission medications   Medication Sig Start Date End Date Taking? Authorizing Provider  clindamycin (CLINDAGEL) 1 % gel  12/11/18   [provider]  EPIDUO FORTE 0.3-2.5 % GEL APPLY TO ACNE EVERY NIGHT AT BEDTIME AS DIRECTED 09/17/17   [provider]  meloxicam (MOBIC) 15 MG tablet Take 1 tablet daily with food for 7 days. Then take as needed. 12/23/18   Ralene Cork, DO  minocycline (MINOCIN,DYNACIN) 50 MG capsule Take 50 mg by mouth 2 (two) times daily. 09/17/17   [provider]    Family History Family History  Problem Relation Age of Onset  . Healthy Mother   . Hypertension Father     Social History Social History   Tobacco Use  . Smoking status: Never Smoker  . Smokeless  tobacco: Never Used  Substance Use Topics  . Alcohol use: No  . Drug use: No     Allergies   Patient has no known allergies.   Review of Systems Review of Systems  Constitutional: Positive for appetite change. Negative for chills, fatigue and fever.  HENT: Positive for sore throat and trouble swallowing. Negative for congestion.   Respiratory: Negative for cough and shortness of breath.   Gastrointestinal: Negative for abdominal pain and nausea.  Musculoskeletal: Positive for myalgias.  Neurological: Negative for dizziness and headaches.     Physical Exam Triage Vital Signs ED Triage Vitals  Enc Vitals Group     BP 04/11/19 1737 124/72     Pulse Rate 04/11/19 1737 83     Resp 04/11/19 1737 15     Temp 04/11/19 1737 98.2 F (36.8 C)     Temp Source 04/11/19 1737 Oral     SpO2 04/11/19 1737 99 %     Weight --      Height --      Head Circumference --      Peak Flow --      Pain Score 04/11/19 1735 7     Pain Loc --      Pain Edu? --  Excl. in GC? --    No data found.  Updated Vital Signs BP 124/72 (BP Location: Left Arm)   Pulse 83   Temp 98.2 F (36.8 C) (Oral)   Resp 15   SpO2 99%      Physical Exam Constitutional:      General: He is not in acute distress.    Appearance: He is well-developed. He is ill-appearing.     Comments: Appears tired  HENT:     Head: Normocephalic and atraumatic.     Right Ear: Tympanic membrane and ear canal normal.     Left Ear: Tympanic membrane and ear canal normal.     Ears:     Comments: Cerumen partially blocked both TMs    Nose: No congestion.     Mouth/Throat:     Mouth: Mucous membranes are moist.     Pharynx: Posterior oropharyngeal erythema present.     Tonsils: No tonsillar exudate. 1+ on the right. 1+ on the left.  Eyes:     Conjunctiva/sclera: Conjunctivae normal.     Pupils: Pupils are equal, round, and reactive to light.  Cardiovascular:     Rate and Rhythm: Normal rate and regular rhythm.      Heart sounds: Normal heart sounds.  Pulmonary:     Effort: Pulmonary effort is normal. No respiratory distress.     Breath sounds: Normal breath sounds.  Abdominal:     General: Bowel sounds are normal. There is no distension.     Palpations: Abdomen is soft.  Musculoskeletal:        General: Normal range of motion.     Cervical back: Normal range of motion.  Lymphadenopathy:     Cervical: Cervical adenopathy present.  Skin:    General: Skin is warm and dry.  Neurological:     General: No focal deficit present.     Mental Status: He is alert.  Psychiatric:        Mood and Affect: Mood normal.        Behavior: Behavior normal.      UC Treatments / Results  Labs (all labs ordered are listed, but only abnormal results are displayed) Labs Reviewed  CULTURE, GROUP A STREP [facility])  POCT RAPID STREP A   -Strep is negative.  Confirmation is sent to laboratory EKG   Radiology No results found.  Procedures Procedures (including critical care time)  Medications Ordered in UC Medications - No data to display  Initial Impression / Assessment and Plan / UC Course  I have reviewed the triage vital signs and the nursing notes.  Pertinent labs & imaging results that were available during my care of the patient were reviewed by me and considered in my medical decision making (see chart for details).     Reviewed with patient that he must quarantine until his test results are available. .  Reviewed symptomatic care sore throat Final Clinical Impressions(s) / UC Diagnoses   Final diagnoses:  Acute pharyngitis, unspecified etiology     Discharge Instructions     The strep test is negative Culture is pending This is likely a sore throat caused by a virus Covid testing is also pending You need to stay home and quarantine until your test result is available You may take Tylenol alternating with ibuprofen to reduce pain and fever You may try Chloraseptic spray or lozenges to  reduce throat pain Salt water gargles are often helpful You can get your test results on MyChart  ED Prescriptions    None     PDMP not reviewed this encounter.   Raylene Everts, MD 04/11/19 2126

## 2019-04-14 LAB — CULTURE, GROUP A STREP (THRC)

## 2019-06-28 ENCOUNTER — Ambulatory Visit: Payer: 59 | Attending: Internal Medicine

## 2019-06-28 DIAGNOSIS — Z23 Encounter for immunization: Secondary | ICD-10-CM

## 2019-06-28 NOTE — Progress Notes (Signed)
   Covid-19 Vaccination Clinic  Name:  Kevin Lane    MRN: [id number] DOB: [birth year]  06/28/2019  [name] was observed post Covid-19 immunization for 15 minutes without incident. He was provided with Vaccine Information Sheet and instruction to access the V-Safe system.   [name] was instructed to call 911 with any severe reactions post vaccine: Marland Kitchen Difficulty breathing  . Swelling of face and throat  . A fast heartbeat  . A bad rash all over body  . Dizziness and weakness   Immunizations Administered    Name Date Dose VIS Date Route   Pfizer COVID-19 Vaccine 06/28/2019 10:10 AM 0.3 mL 02/28/2019 Intramuscular   Manufacturer: [employer], [employer]   Lot: [id number]   NDC: [address]-0

## 2019-07-21 ENCOUNTER — Ambulatory Visit: Payer: 59

## 2019-07-23 ENCOUNTER — Ambulatory Visit: Payer: 59 | Attending: Internal Medicine

## 2019-07-23 DIAGNOSIS — Z23 Encounter for immunization: Secondary | ICD-10-CM

## 2019-07-23 NOTE — Progress Notes (Signed)
   Covid-19 Vaccination Clinic  Name:  Kevin Lane    MRN: [id number] DOB: Apr 27, 2000  07/23/2019  [name] was observed post Covid-19 immunization for 15 minutes without incident. He was provided with Vaccine Information Sheet and instruction to access the V-Safe system.   [name] was instructed to call 911 with any severe reactions post vaccine: Marland Kitchen Difficulty breathing  . Swelling of face and throat  . A fast heartbeat  . A bad rash all over body  . Dizziness and weakness   Immunizations Administered    Name Date Dose VIS Date Route   Pfizer COVID-19 Vaccine 07/23/2019 12:01 PM 0.3 mL 05/14/2018 Intramuscular   Manufacturer: [employer], [employer]   Lot: [id number]   NDC: [address]-5

## 2019-09-01 ENCOUNTER — Other Ambulatory Visit: Payer: Self-pay

## 2019-09-01 ENCOUNTER — Ambulatory Visit (INDEPENDENT_AMBULATORY_CARE_PROVIDER_SITE_OTHER): Payer: 59 | Admitting: Pediatrics

## 2019-09-01 VITALS — Wt 141.3 lb

## 2019-09-01 DIAGNOSIS — R350 Frequency of micturition: Secondary | ICD-10-CM

## 2019-09-01 DIAGNOSIS — N3289 Other specified disorders of bladder: Secondary | ICD-10-CM | POA: Diagnosis not present

## 2019-09-01 LAB — POCT URINALYSIS DIPSTICK
Bilirubin, UA: NEGATIVE
Blood, UA: NEGATIVE
Glucose, UA: NEGATIVE
Ketones, UA: NEGATIVE
Leukocytes, UA: NEGATIVE
Nitrite, UA: NEGATIVE
Protein, UA: NEGATIVE
Spec Grav, UA: 1.01 (ref 1.010–1.025)
Urobilinogen, UA: 0.2 E.U./dL
pH, UA: 7 (ref 5.0–8.0)

## 2019-09-03 ENCOUNTER — Encounter: Payer: Self-pay | Admitting: Pediatrics

## 2019-09-03 DIAGNOSIS — N3289 Other specified disorders of bladder: Secondary | ICD-10-CM | POA: Insufficient documentation

## 2019-09-03 DIAGNOSIS — R350 Frequency of micturition: Secondary | ICD-10-CM | POA: Insufficient documentation

## 2019-09-03 LAB — URINE CULTURE
MICRO NUMBER:: 10592830
Result:: NO GROWTH
SPECIMEN QUALITY:: ADEQUATE

## 2019-09-03 NOTE — Progress Notes (Signed)
Subjective:    Kevin Lane is a 19 y.o. male who complains of dysuria and frequency for a few days.  Patient also complains of stomach ache. Patient denies back pain, congestion, cough, fever and headache.  Patient does not have a history of recurrent UTI.  Patient does not have a history of pyelonephritis. The following portions of the patient's history were reviewed and updated as appropriate: allergies, current medications, past family history, past medical history, past social history, past surgical history and problem list. Review of Systems Pertinent items are noted in HPI.    Objective:    Wt 141 lb 4.8 oz (64.1 kg)   BMI 21.48 kg/m  General: alert, cooperative and no distress  Abdomen: soft, non-tender, without masses or organomegaly in the lower abdomen  Back: back muscles are full ROM  GU: defer exam   Laboratory:  Urine dipstick shows negative for all components.   Micro exam: negative for WBCs or RBCs.    Assessment:    Genital irritation    Plan: Plan:    1. Medications: not indicated at this time 2. Maintain adequate hydration 3. Follow up if symptoms not improving, and prn.

## 2019-09-03 NOTE — Patient Instructions (Signed)

## 2019-10-11 IMAGING — DX DG THORACIC SPINE 2V
3 series · 3 of 3 positions shown · non-contrast
Comparison: No recent.

CLINICAL DATA: Chronic scapular pain.  No injury.

EXAM:
THORACIC SPINE 2 VIEWS

[t thoracic spine ap]
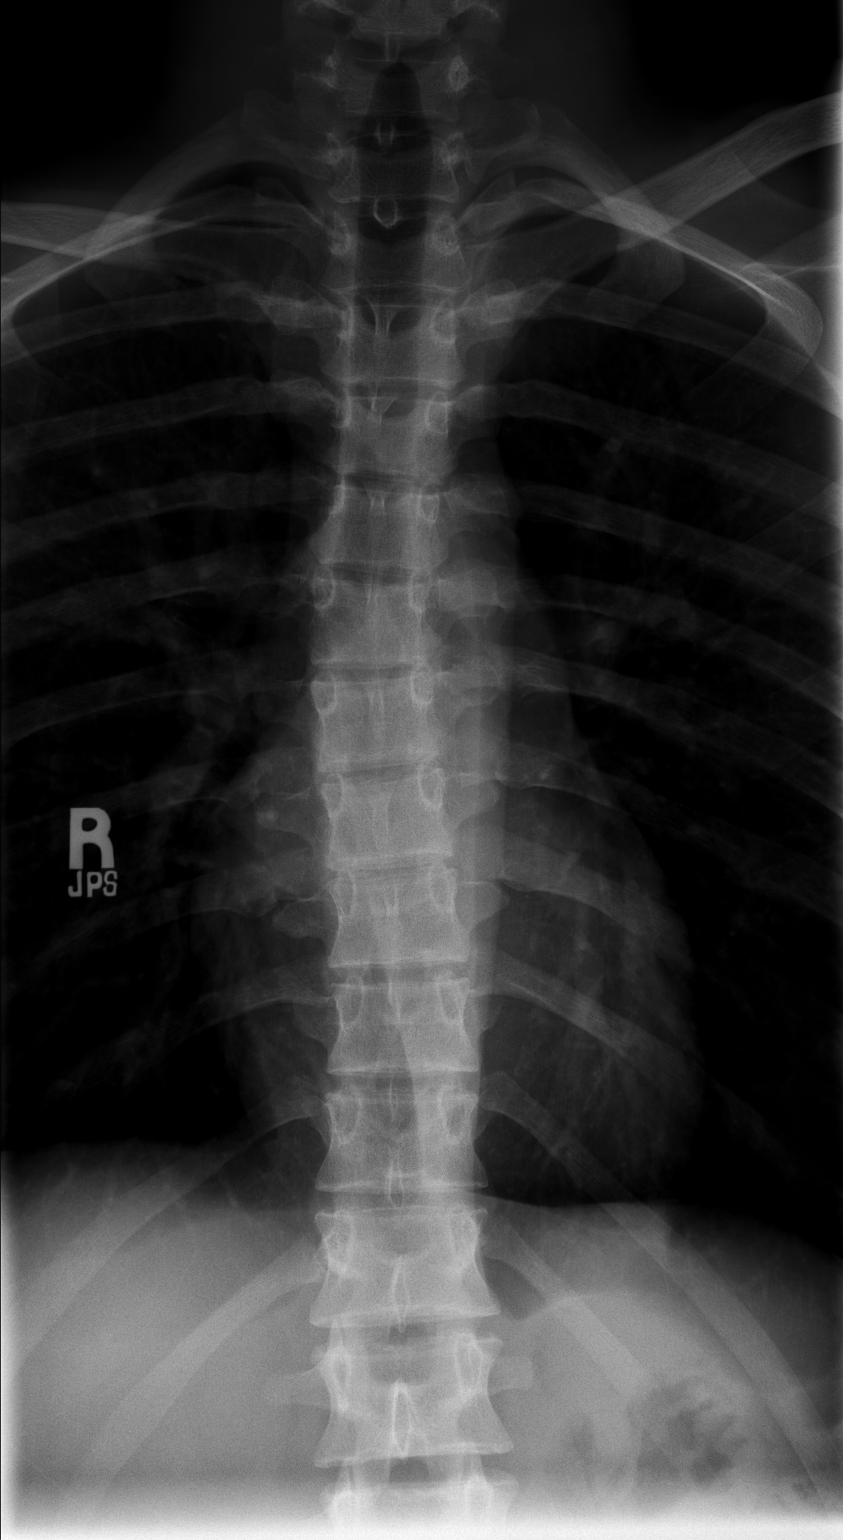

[t thoracic breathing lat]
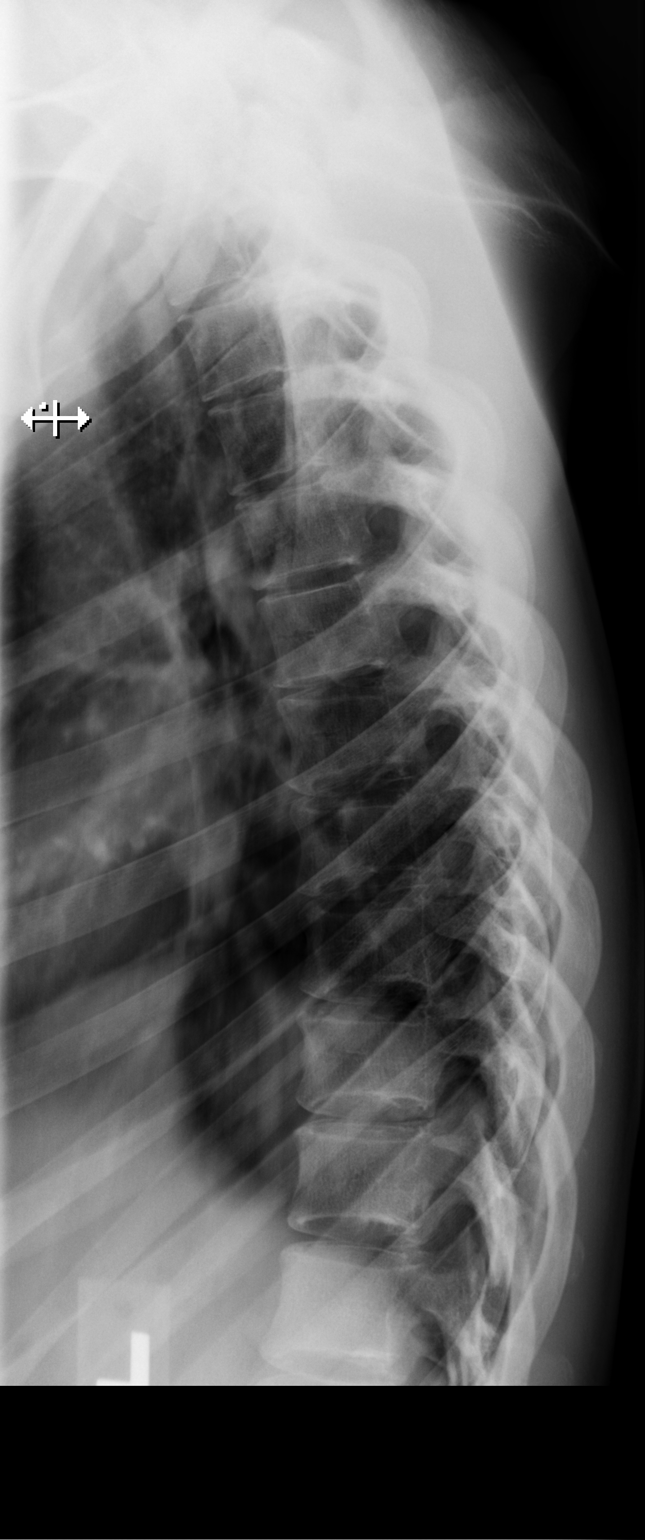

[t thoracic swimmers]
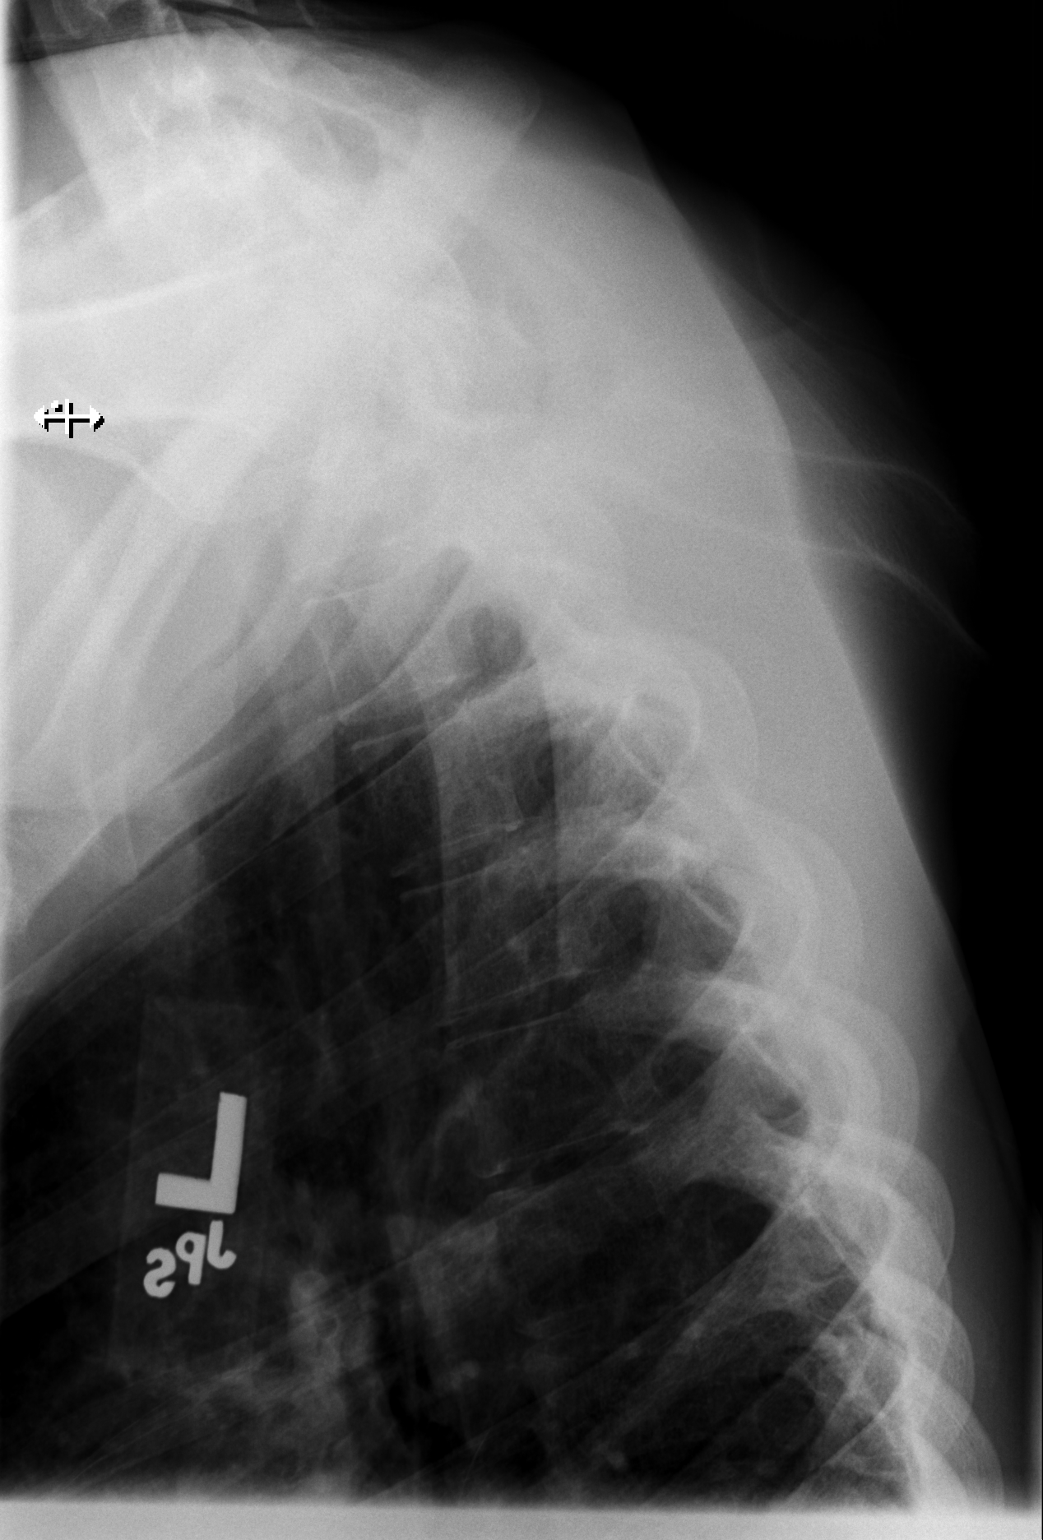

[3 of 3 positions shown; findings below may reference images not displayed]

FINDINGS: S-shaped thoracic spine scoliosis. No acute or focal bony
abnormality. No evidence of fracture.
IMPRESSION: S shaped scoliosis.  No acute or focal bony abnormality.

## 2020-01-21 DIAGNOSIS — M419 Scoliosis, unspecified: Secondary | ICD-10-CM | POA: Diagnosis not present

## 2020-01-21 DIAGNOSIS — G5620 Lesion of ulnar nerve, unspecified upper limb: Secondary | ICD-10-CM | POA: Diagnosis not present

## 2020-01-21 DIAGNOSIS — L709 Acne, unspecified: Secondary | ICD-10-CM | POA: Diagnosis not present

## 2020-02-03 DIAGNOSIS — Z20822 Contact with and (suspected) exposure to covid-19: Secondary | ICD-10-CM | POA: Diagnosis not present

## 2020-06-17 DIAGNOSIS — Z0000 Encounter for general adult medical examination without abnormal findings: Secondary | ICD-10-CM | POA: Diagnosis not present

## 2020-06-17 DIAGNOSIS — R7989 Other specified abnormal findings of blood chemistry: Secondary | ICD-10-CM | POA: Diagnosis not present

## 2020-06-24 DIAGNOSIS — M9900 Segmental and somatic dysfunction of head region: Secondary | ICD-10-CM | POA: Diagnosis not present

## 2020-06-24 DIAGNOSIS — Z0000 Encounter for general adult medical examination without abnormal findings: Secondary | ICD-10-CM | POA: Diagnosis not present

## 2020-06-24 DIAGNOSIS — G5620 Lesion of ulnar nerve, unspecified upper limb: Secondary | ICD-10-CM | POA: Diagnosis not present

## 2020-06-24 DIAGNOSIS — M9908 Segmental and somatic dysfunction of rib cage: Secondary | ICD-10-CM | POA: Diagnosis not present

## 2020-06-24 DIAGNOSIS — R82998 Other abnormal findings in urine: Secondary | ICD-10-CM | POA: Diagnosis not present

## 2020-06-24 DIAGNOSIS — M9902 Segmental and somatic dysfunction of thoracic region: Secondary | ICD-10-CM | POA: Diagnosis not present

## 2020-06-24 DIAGNOSIS — M9907 Segmental and somatic dysfunction of upper extremity: Secondary | ICD-10-CM | POA: Diagnosis not present

## 2020-06-24 DIAGNOSIS — F419 Anxiety disorder, unspecified: Secondary | ICD-10-CM | POA: Diagnosis not present

## 2020-06-24 DIAGNOSIS — Z1389 Encounter for screening for other disorder: Secondary | ICD-10-CM | POA: Diagnosis not present

## 2020-06-24 DIAGNOSIS — Z1331 Encounter for screening for depression: Secondary | ICD-10-CM | POA: Diagnosis not present

## 2020-06-24 DIAGNOSIS — Z23 Encounter for immunization: Secondary | ICD-10-CM | POA: Diagnosis not present

## 2020-06-24 DIAGNOSIS — M9901 Segmental and somatic dysfunction of cervical region: Secondary | ICD-10-CM | POA: Diagnosis not present

## 2020-07-25 DIAGNOSIS — Z03818 Encounter for observation for suspected exposure to other biological agents ruled out: Secondary | ICD-10-CM | POA: Diagnosis not present

## 2020-07-25 DIAGNOSIS — J029 Acute pharyngitis, unspecified: Secondary | ICD-10-CM | POA: Diagnosis not present

## 2020-07-25 DIAGNOSIS — U071 COVID-19: Secondary | ICD-10-CM | POA: Diagnosis not present

## 2020-07-25 DIAGNOSIS — Z20822 Contact with and (suspected) exposure to covid-19: Secondary | ICD-10-CM | POA: Diagnosis not present

## 2020-11-18 ENCOUNTER — Other Ambulatory Visit: Payer: Self-pay

## 2020-11-18 ENCOUNTER — Ambulatory Visit: Payer: 59 | Admitting: Sports Medicine

## 2020-11-18 VITALS — Ht 68.0 in | Wt 143.0 lb

## 2020-11-18 DIAGNOSIS — S46812A Strain of other muscles, fascia and tendons at shoulder and upper arm level, left arm, initial encounter: Secondary | ICD-10-CM | POA: Diagnosis not present

## 2020-11-18 NOTE — Patient Instructions (Signed)
  It was great seeing you today. We will give you some home exercises to try. If you are pain does not continue to improve that I would recommend a short course of meloxicam and physical therapy.

## 2020-11-18 NOTE — Progress Notes (Signed)
   Subjective:    Patient ID: Kevin Lane, male    DOB: Aug 08, 2000, 20 y.o.   MRN: [id number]  HPI chief complaint: Left-sided neck pain  [name] presents today complaining of several days of left-sided neck pain that he localizes to the trapezius.  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  He believes that his pain is ergonomically related.  In fact, he has changed his workstation a bit which has started to help.  Pain does not radiate down the arm.  No pain at night when sleeping.  He has been using topical Salonpas which has been helping.  Interim medical history reviewed Medications reviewed Allergies reviewed    Review of Systems As above    Objective:   Physical Exam  Well developed, well nourished.  No acute distress  Cervical spine: Good cervical range of motion.  He does have some pain with cervical rotation to the right.  He is slightly tender to palpation throughout the left trapezius but no spasm.  No gross focal neurological deficit of either upper extremity.      Assessment & Plan:   Left trapezius strain  I believe Davione has already figured out the etiology of his discomfort.  I agree that this is an ergonomic problem and he will continue to work on that on his own.  Continue with topical Salonpas as needed.  If symptoms persist then we will trial a short course of meloxicam and consider a referral to physical therapy.  Follow-up for ongoing or recalcitrant issues.

## 2021-02-18 ENCOUNTER — Ambulatory Visit: Payer: 59 | Admitting: Sports Medicine

## 2021-02-18 VITALS — BP 110/78 | Ht 68.0 in | Wt 138.0 lb

## 2021-02-18 DIAGNOSIS — M545 Low back pain, unspecified: Secondary | ICD-10-CM

## 2021-02-18 NOTE — Progress Notes (Signed)
   SUBJECTIVE:   CHIEF COMPLAINT / HPI:    Kevin Lane is a 20 y.o. male here for low back pain for the past 5 days. Pt bent down slightly to pet his cat and got immediate pain. Reports it felt like his "back gave out." Pain described as sharp and is bilateral but worse on the left. Does not radiate down legs. Aggravated by sitting and driving better with lying flat. Had similar sx a several times before that resolved without treatment. Tried Solanpas, Tiger palms and pain relieving patches.  Took some Advil.    PERTINENT  PMH / PSH: reviewed and updated as appropriate   OBJECTIVE:   BP 110/78   Ht 5\' 8"  (1.727 m)   Wt 138 lb (62.6 kg)   BMI 20.98 kg/m    GEN: well appearing male in no acute distress  CVS: well perfused  RESP: speaking in full sentences without pause, no respiratory distress  Lumbar spine: - Inspection: lower T and L spinous process prominence while sitting upright, no swelling or ecchymosis. No overlying skin changes - Palpation: No TTP over the spinous processes, paraspinal muscles, or SI joints b/l - ROM: full active ROM of the lumbar spine in flexion and extension, pain with extension - Strength: 5/5 strength of BLE - Neuro: sensation intact in the L4-S1 nerve root distribution b/l, 2+ L4 and S1 reflexes - Special testing: Negative straight leg raise   ASSESSMENT/PLAN:   Low back pain Pt is a 20 yo male with history of scoliosis with acute on chronic back pain. Suspect the structural abnormalities related to his scoliosis are the underlying cause of his low back pain. Discussed physical therapy with him to treat and train his core and hopefully build a good home exercise program. He is agreeable. Referral placed for [facility] near his home.  Continue OTC analgesics.     19, DO PGY-3, [facility] 02/18/2021

## 2021-02-18 NOTE — Patient Instructions (Signed)
It was great seeing you today! Your low back pain is likely complicated by having scoliosis.   You were referred to physical therapy ([facility]) to strengthen your core. You should continue moving as excessive rest may weaken you lower back muscles. Continue using pain relieving agents as needed.   Take care,   [facility]

## 2021-02-21 ENCOUNTER — Encounter: Payer: Self-pay | Admitting: Sports Medicine

## 2021-02-21 DIAGNOSIS — M545 Low back pain, unspecified: Secondary | ICD-10-CM | POA: Insufficient documentation

## 2021-02-21 NOTE — Assessment & Plan Note (Signed)
Pt is a 20 yo male with history of scoliosis with acute on chronic back pain. Suspect the structural abnormalities related to his scoliosis are the underlying cause of his low back pain. Discussed physical therapy with him to treat and train his core and hopefully build a good home exercise program. He is agreeable. Referral placed for [facility] near his home.  Continue OTC analgesics.

## 2021-02-28 DIAGNOSIS — Z23 Encounter for immunization: Secondary | ICD-10-CM | POA: Diagnosis not present

## 2021-03-01 ENCOUNTER — Other Ambulatory Visit (HOSPITAL_COMMUNITY): Payer: Self-pay

## 2021-03-01 MED ORDER — AMOXICILLIN-POT CLAVULANATE 875-125 MG PO TABS
1.0000 | ORAL_TABLET | Freq: Two times a day (BID) | ORAL | 1 refills | Status: DC
  Filled 2021-03-01: qty 18, 9d supply, fill #0
  Filled 2021-03-01: qty 2, 1d supply, fill #0

## 2021-03-02 ENCOUNTER — Other Ambulatory Visit (HOSPITAL_COMMUNITY): Payer: Self-pay

## 2021-03-15 ENCOUNTER — Other Ambulatory Visit (HOSPITAL_COMMUNITY): Payer: Self-pay

## 2021-03-15 DIAGNOSIS — Z20822 Contact with and (suspected) exposure to covid-19: Secondary | ICD-10-CM | POA: Diagnosis not present

## 2021-03-15 MED ORDER — AMOXICILLIN-POT CLAVULANATE 875-125 MG PO TABS
ORAL_TABLET | ORAL | 1 refills | Status: DC
  Filled 2021-03-15: qty 20, 10d supply, fill #0

## 2021-03-31 ENCOUNTER — Ambulatory Visit: Payer: Self-pay

## 2021-07-07 DIAGNOSIS — Z0000 Encounter for general adult medical examination without abnormal findings: Secondary | ICD-10-CM | POA: Diagnosis not present

## 2021-07-07 DIAGNOSIS — R7989 Other specified abnormal findings of blood chemistry: Secondary | ICD-10-CM | POA: Diagnosis not present

## 2021-09-13 ENCOUNTER — Encounter: Payer: Self-pay | Admitting: Family Medicine

## 2021-09-13 ENCOUNTER — Ambulatory Visit: Payer: 59 | Admitting: Family Medicine

## 2021-09-13 ENCOUNTER — Other Ambulatory Visit (HOSPITAL_COMMUNITY): Payer: Self-pay

## 2021-09-13 VITALS — BP 122/68 | Ht 68.0 in | Wt 140.0 lb

## 2021-09-13 DIAGNOSIS — S39012A Strain of muscle, fascia and tendon of lower back, initial encounter: Secondary | ICD-10-CM | POA: Diagnosis not present

## 2021-09-13 DIAGNOSIS — M545 Low back pain, unspecified: Secondary | ICD-10-CM

## 2021-09-13 MED ORDER — CYCLOBENZAPRINE HCL 5 MG PO TABS
5.0000 mg | ORAL_TABLET | Freq: Three times a day (TID) | ORAL | 0 refills | Status: DC | PRN
  Filled 2021-09-13: qty 20, 7d supply, fill #0

## 2021-09-13 NOTE — Progress Notes (Signed)
    SUBJECTIVE:   CHIEF COMPLAINT / HPI:   Low back pain Patient presents today for low back pain.  He has had history of this and was most recently seen in December 2022.  He reports that since then he has had intermittent episodes where he has a sharp pain where it feels like "my back gives out".  He said most recently was Sunday.  He went skating on Saturday and was doing fine until Sunday and was walking around on Sunday and suddenly had the sharp pain in his left side of his back.  Reports that the pain has been improving since Sunday but is still present.  Occurs when he moves certain ways.  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  No red flag symptoms such as weakness in the lower extremities, loss of bowel or bladder function.  OBJECTIVE:   BP 122/68 (BP Location: Right Arm, Patient Position: Sitting, Cuff Size: Normal)   Ht 5\' 8"  (1.727 m)   Wt 140 lb (63.5 kg)   BMI 21.29 kg/m   General: Pleasant 21 year old male in no acute distress  MSK: No gross deformity, scoliosis. TTP in the paraspinal muscles of the lumbar region, worse on the left than the right.  No midline or bony TTP. FROM although lateral rotation as well as flexion cause pain. Strength LEs 5/5 all muscle groups.   2+ MSRs in patellar and achilles tendons, equal bilaterally. Negative SLRs. Sensation intact to light touch bilaterally.  ASSESSMENT/PLAN:   Low back pain 21 year old male with history of scoliosis coming in for acute on chronic low back pain.  This is likely muscular in origin with lumbar strain.  Discussed stretching and strengthening exercises as well as physical therapy.  Placed referral for physical therapy and short course of muscle relaxants which she can take at night if needed.  Discussed continued use of over-the-counter topical medications as well as oral medications.  Strict ED and return precautions given.     Celedonio Savage, MD [facility]

## 2021-09-26 NOTE — Therapy (Signed)
OUTPATIENT PHYSICAL THERAPY THORACOLUMBAR EVALUATION   Patient Name: Kevin Lane MRN: [id number] DOB:2000-08-31, 21 y.o., male Today's Date: 09/27/2021   PT End of Session - 09/27/21 2029     Visit Number 1    Date for PT Re-Evaluation 10/28/21    Authorization Type [facility]    Authorization Time Period 09/27/21 to 10/28/21    PT Start Time 1446    PT Stop Time 1535    PT Time Calculation (min) 49 min    Activity Tolerance Patient tolerated treatment well;No increased pain    Behavior During Therapy [facility] for tasks assessed/performed             Past Medical History:  Diagnosis Date   Allergy    Depression with anxiety April 2015   History reviewed. No pertinent surgical history. 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   Irritation of right ulnar nerve 12/23/2018   Encounter for routine child health examination without abnormal findings 10/24/2016   BMI (body mass index), pediatric, 5% to less than 85% for age 23/09/2013   Depression with anxiety 06/24/2013   Inflammatory acne 06/24/2013    [facility]   REFERRING PROVIDER: Norton Blizzard, MD  REFERRING DIAG: Strain of lumbar region, initial encounter  Rationale for Evaluation and Treatment Rehabilitation  THERAPY DIAG:  Chronic bilateral low back pain without sciatica - Plan: PT plan of care cert/re-cert  ONSET DATE: atleast 1 year ago  SUBJECTIVE:                                                                                                                                                                                           SUBJECTIVE STATEMENT: Pt states that his back will bother him on and off from different random movements. He will have to rest and lay on either side to avoid pain for several days following this. Otherwise,  his back will catch him every now and when performing quick movements, reaching high and twisting, flexing his spine and hip at the same time. He denies numbness/tingling. PERTINENT HISTORY:  anxiety  PAIN:  Are you having pain? Yes : NPRS scale: 2-3/10 Pain location: low left lumbar spine Pain description: dull/achey Aggravating factors: quick movements, twisting while reaching overhead, and flexing the spine  Relieving factors: sidelying position   PRECAUTIONS: None  WEIGHT BEARING RESTRICTIONS No  FALLS:  Has patient fallen in last 6 months? No  LIVING ENVIRONMENT: Lives with: lives with their  family Lives in: House/apartment    OCCUPATION: drawing at a desk, sometimes teaching at a college   PLOF: Independent  PATIENT GOALS improve pain with daily activity   OBJECTIVE:   DIAGNOSTIC FINDINGS:  None   PATIENT SURVEYS:  FOTO: 47  SCREENING FOR RED FLAGS: Bowel or bladder incontinence: No Spinal tumors: No Cauda equina syndrome: No Compression fracture: No Abdominal aneurysm: No  COGNITION:  Overall cognitive status: Within functional limits for tasks assessed     SENSATION: Not tested- denies numbness/tingling   MUSCLE LENGTH: Not tested   POSTURE: rounded shoulders, decreased lumbar lordosis, and posterior pelvic tilt (seated)  PALPATION: Tenderness Lt low lumbar paraspinals   LUMBAR ROM:   Active  A/PROM  eval  Flexion WNL  Extension WNL- pain with end range PROM also painful end range   Right lateral flexion   Left lateral flexion   Right rotation WNL, pain free  Left rotation WNL, pain free    (Blank rows = not tested)  LOWER EXTREMITY ROM:     Active  Right eval Left eval  Hip flexion    Hip extension    Hip abduction    Hip adduction    Hip internal rotation    Hip external rotation    Knee flexion    Knee extension    Ankle dorsiflexion    Ankle plantarflexion    Ankle inversion    Ankle eversion     (Blank rows = not  tested)  LOWER EXTREMITY MMT:    MMT Right eval Left eval  Hip flexion    Hip extension    Hip abduction    Hip adduction    Hip internal rotation    Hip external rotation    Knee flexion    Knee extension    Ankle dorsiflexion    Ankle plantarflexion    Ankle inversion    Ankle eversion     (Blank rows = not tested)  LUMBAR SPECIAL TESTS:  Plank on elbows/feet: increased lumbar lordosis poor lower abdominal activation x5 sec     TODAY'S TREATMENT  Plank elbows/feet: 5x5 sec hold- PT cuing to adjust excessive lumbar lordosis  Deadbug 2x5 reps each side- PT cuing to maintain proper spinal posture   PATIENT EDUCATION:  Education details: eval findings/POC; HEP Person educated: Patient Education method: Explanation, Actor cues, Verbal cues, and Handouts Education comprehension: verbalized understanding and returned demonstration   HOME EXERCISE PROGRAM: Access Code: [email or web address] URL: [email or web address] Date: 09/27/2021 Prepared by: [facility]  Exercises - Standard Plank  - 2 x daily - 7 x weekly - 5-10 reps - 5-20 seconds hold - Supine Dead Bug with Leg Extension  - 2 x daily - 7 x weekly - 3 sets - 5-10 reps  ASSESSMENT:  CLINICAL IMPRESSION: Patient is a 21 y.o. M who was seen today for physical therapy evaluation and treatment for complaints of acute on chronic low back pain. He has periodic spells of back pain which typically resolve on their own. He has full lumbar ROM, with pain at end range active/passive lumbar extension. Pt lacks adequate trunk strength and neuromuscular control evident by shaking and poor posture during a plank hold. He would benefit from skilled PT to address his limitations in strength and endurance in addition to education on proper posture throughout the day in order to decrease muscle spasm and improve quality of life.    OBJECTIVE IMPAIRMENTS decreased endurance,  decreased strength,  increased muscle spasms, improper body mechanics, postural dysfunction, and pain.   ACTIVITY LIMITATIONS bending and reach over head  PARTICIPATION LIMITATIONS: occupation  PERSONAL FACTORS Age, Past/current experiences, and Time since onset of injury/illness/exacerbation are also affecting patient's functional outcome.   REHAB POTENTIAL: Excellent  CLINICAL DECISION MAKING: Stable/uncomplicated  EVALUATION COMPLEXITY: Low   GOALS: Goals reviewed with patient? Yes  SHORT TERM GOALS: Target date: 10/11/2021  Pt will be independent with his initial HEP to improve trunk strength. Baseline: Goal status: INITIAL    LONG TERM GOALS: Target date: 10/25/2021  Pt will have pain free active and passive lumbar extension ROM. Baseline:  Goal status: INITIAL  2.  Pt will have improve trunk strength evident by his ability to maintain plank on elbows/feet for up to 20 seconds, atleast 2/3 trials, without loss of trunk stability or pain. Baseline:  Goal status: INITIAL  3.  Pt will report atleast 50% improvement in his low back discomfort with various activities throughout the week.  Baseline:  Goal status: INITIAL    PLAN: PT FREQUENCY: 1x/week  PT DURATION: 4 weeks  PLANNED INTERVENTIONS: Therapeutic exercises, Therapeutic activity, Neuromuscular re-education, Patient/Family education, Joint manipulation, Joint mobilization, Dry Needling, Spinal manipulation, Spinal mobilization, Moist heat, Taping, and Manual therapy.  PLAN FOR NEXT SESSION: progress trunk strength, try half kneel activity with proper spinal alignment-progressing to standing when able, STM/dry needling as needed to decrease muscle spasm in paraspinals  8:54 PM,09/27/21 Donita Brooks PT, DPT [facility] [place]  [phone]

## 2021-09-27 ENCOUNTER — Encounter: Payer: Self-pay | Admitting: Physical Therapy

## 2021-09-27 ENCOUNTER — Ambulatory Visit: Payer: 59 | Attending: Family Medicine | Admitting: Physical Therapy

## 2021-09-27 DIAGNOSIS — X58XXXA Exposure to other specified factors, initial encounter: Secondary | ICD-10-CM | POA: Insufficient documentation

## 2021-09-27 DIAGNOSIS — G8929 Other chronic pain: Secondary | ICD-10-CM

## 2021-09-27 DIAGNOSIS — S39012A Strain of muscle, fascia and tendon of lower back, initial encounter: Secondary | ICD-10-CM | POA: Diagnosis not present

## 2021-10-05 ENCOUNTER — Ambulatory Visit: Payer: 59 | Admitting: Physical Therapy

## 2021-10-05 DIAGNOSIS — M545 Low back pain, unspecified: Secondary | ICD-10-CM

## 2021-10-05 DIAGNOSIS — S39012A Strain of muscle, fascia and tendon of lower back, initial encounter: Secondary | ICD-10-CM | POA: Diagnosis not present

## 2021-10-05 NOTE — Therapy (Signed)
OUTPATIENT PHYSICAL THERAPY THORACOLUMBAR PROGRESS NOTE   Patient Name: Kevin Lane MRN: [id number] DOB:[birth year], 21 y.o., male Today's Date: 10/05/2021   PT End of Session - 10/05/21 1230     Visit Number 2    Date for PT Re-Evaluation 10/28/21    Authorization Type [facility]    Authorization Time Period 09/27/21 to 10/28/21    PT Start Time 1231    PT Stop Time 1320    PT Time Calculation (min) 49 min    Activity Tolerance Patient tolerated treatment well;No increased pain             Past Medical History:  Diagnosis Date   Allergy    Depression with anxiety April 2015   No past surgical history on file. 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   Irritation of right ulnar nerve 12/23/2018   Encounter for routine child health examination without abnormal findings 10/24/2016   BMI (body mass index), pediatric, 5% to less than 85% for age 42/09/2013   Depression with anxiety 06/24/2013   Inflammatory acne 06/24/2013    PCP: [facility]   REFERRING PROVIDER: Norton Blizzard, MD  REFERRING DIAG: Strain of lumbar region, initial encounter  Rationale for Evaluation and Treatment Rehabilitation  THERAPY DIAG:  Chronic bilateral low back pain without sciatica  ONSET DATE: atleast 1 year ago  SUBJECTIVE:                                                                                                                                                                                           SUBJECTIVE STATEMENT:  Been working on dead bugs and planks and going OK.  Car ride to [facility] caused bad pain "11/10 one of worst experiences of my life". Starts left low back and then goes across to right.  Walking and standing relieves.   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 Yes : NPRS scale: 3-4/10 Pain location: low left lumbar spine Pain description: dull/achey Aggravating factors: quick movements, twisting while reaching overhead, and flexing the spine  Relieving factors: sidelying position   PRECAUTIONS: None  WEIGHT BEARING RESTRICTIONS No  FALLS:  Has patient fallen in last 6 months? No  LIVING ENVIRONMENT: Lives with: lives with their family Lives in: House/apartment    OCCUPATION: drawing at a desk, sometimes teaching at a college   PLOF: Independent  PATIENT GOALS improve pain with daily  activity   OBJECTIVE:   DIAGNOSTIC FINDINGS:  None   PATIENT SURVEYS:  FOTO: 47  SCREENING FOR RED FLAGS: Bowel or bladder incontinence: No Spinal tumors: No Cauda equina syndrome: No Compression fracture: No Abdominal aneurysm: No  COGNITION:  Overall cognitive status: Within functional limits for tasks assessed     SENSATION: Not tested- denies numbness/tingling   MUSCLE LENGTH: Not tested   POSTURE: rounded shoulders, decreased lumbar lordosis, and posterior pelvic tilt (seated)  PALPATION: Tenderness Lt low lumbar paraspinals   LUMBAR ROM:   Active  A/PROM  eval  Flexion WNL  Extension WNL- pain with end range PROM also painful end range   Right lateral flexion   Left lateral flexion   Right rotation WNL, pain free  Left rotation WNL, pain free    (Blank rows = not tested)  LOWER EXTREMITY ROM:     Active  Right eval Left eval  Hip flexion    Hip extension    Hip abduction    Hip adduction    Hip internal rotation    Hip external rotation    Knee flexion    Knee extension    Ankle dorsiflexion    Ankle plantarflexion    Ankle inversion    Ankle eversion     (Blank rows = not tested)  LOWER EXTREMITY MMT:    MMT Right eval Left eval  Hip flexion    Hip extension    Hip abduction    Hip adduction    Hip internal rotation    Hip external rotation    Knee flexion    Knee  extension    Ankle dorsiflexion    Ankle plantarflexion    Ankle inversion    Ankle eversion     (Blank rows = not tested)  LUMBAR SPECIAL TESTS:  Plank on elbows/feet: increased lumbar lordosis poor lower abdominal activation x5 sec     TODAY'S TREATMENT  7/19: Review of initial HEP Use of lumbar roll when sitting, discussion of limitation on sitting, stand up desks Standing extension: hands behind back, in front of counter, against wall Given info on home TENS unit and set up Discussed treatment options and pt expresses interest in try DN Manual therapy: soft tissue mobilization to bil lumbar paraspinals Trigger Point Dry-Needling  Treatment instructions: Expect mild to moderate muscle soreness. S/S of pneumothorax if dry needled over a lung field, and to seek immediate medical attention should they occur. Patient verbalized understanding of these instructions and education.  Patient Consent Given: Yes Education handout provided: Yes Muscles treated: left lumbar multifidi only Treatment response/outcome: the patient became nauseated and reports needles are quite painful, needles removed and had patient roll supine with LEs elevated on bolster, cold washcloth for 10 minutes, transitioned to sitting pt reports he is feeling much better and thinks he was anticipating the needles too much      Plank elbows/feet: 5x5 sec hold- PT cuing to adjust excessive lumbar lordosis  Deadbug 2x5 reps each side- PT cuing to maintain proper spinal posture   PATIENT EDUCATION:  Education details: eval findings/POC; HEP; TENs info  Person educated: Patient Education method: Explanation, Tactile cues, Verbal cues, and Handouts Education comprehension: verbalized understanding and returned demonstration   HOME EXERCISE PROGRAM: Access Code: [email or web address] URL: [email or web address] Date: 09/27/2021 Prepared by: [facility]  Exercises - Standard Plank  - 2 x daily - 7 x weekly - 5-10 reps - 5-20 seconds hold -  Supine Dead Bug with Leg Extension  - 2 x daily - 7 x weekly - 3 sets - 5-10 reps  ASSESSMENT:  CLINICAL IMPRESSION: The patient reports an increase in pain with a recent car ride.  Discussed strategies while riding in the car and while working to help with pain control.  He is receptive to trying DN but half-way through he reports nausea and not feeling well.  He states later he has a similar response when seeing blood.  After lying supine with LEs elevated and applying ice he makes a full recovery.  He does state his left side of his back (where needled) does feel a bit looser following session.     OBJECTIVE IMPAIRMENTS decreased endurance, decreased strength, increased muscle spasms, improper body mechanics, postural dysfunction, and pain.   ACTIVITY LIMITATIONS bending and reach over head  PARTICIPATION LIMITATIONS: occupation  PERSONAL FACTORS Age, Past/current experiences, and Time since onset of injury/illness/exacerbation are also affecting patient's functional outcome.   REHAB POTENTIAL: Excellent  CLINICAL DECISION MAKING: Stable/uncomplicated  EVALUATION COMPLEXITY: Low   GOALS: Goals reviewed with patient? Yes  SHORT TERM GOALS: Target date: 10/19/2021  Pt will be independent with his initial HEP to improve trunk strength. Baseline: Goal status: INITIAL    LONG TERM GOALS: Target date: 11/02/2021  Pt will have pain free active and passive lumbar extension ROM. Baseline:  Goal status: INITIAL  2.  Pt will have improve trunk strength evident by his ability to maintain plank on elbows/feet for up to 20 seconds, atleast 2/3 trials, without loss of trunk stability or pain. Baseline:  Goal status: INITIAL  3.  Pt will report atleast 50% improvement in his low back discomfort with various activities throughout the week.  Baseline:  Goal status: INITIAL    PLAN: PT  FREQUENCY: 1x/week  PT DURATION: 4 weeks  PLANNED INTERVENTIONS: Therapeutic exercises, Therapeutic activity, Neuromuscular re-education, Patient/Family education, Joint manipulation, Joint mobilization, Dry Needling, Spinal manipulation, Spinal mobilization, Moist heat, Taping, and Manual therapy.  PLAN FOR NEXT SESSION:  HOLD DRY NEEDLING;  manual techniques for lumbar fascial and joint mobility; progress trunk strength, try half kneel activity with proper spinal alignment-progressing to standing when able   Lavinia Sharps, PT 10/05/21 4:40 PM Phone: [phone] Fax: [phone]

## 2021-10-05 NOTE — Patient Instructions (Signed)
TENS UNIT  This is helpful for muscle pain and spasm.   Search and Purchase a TENS 7000 2nd edition at www.tenspros.com or www.amazon.com  (It should be less than $30)     TENS unit instructions:  Do not shower or bathe with the unit on Turn the unit off before removing electrodes or batteries If the electrodes lose stickiness add a drop of water to the electrodes after they are disconnected from the unit and place on plastic sheet. If you continued to have difficulty, call the TENS unit company to purchase more electrodes. Do not apply lotion on the skin area prior to use. Make sure the skin is clean and dry as this will help prolong the life of the electrodes. After use, always check skin for unusual red areas, rash or other skin difficulties. If there are any skin problems, does not apply electrodes to the same area. Never remove the electrodes from the unit by pulling the wires. Do not use the TENS unit or electrodes other than as directed. Do not change electrode placement without consulting your therapist or physician. Keep 2 fingers with between each electrode.    Lavinia Sharps PT [facility] [address], [address] [place], [place] [address] Phone # [phone] Fax [phone]

## 2021-10-12 ENCOUNTER — Ambulatory Visit: Payer: 59 | Admitting: Family Medicine

## 2021-10-12 ENCOUNTER — Encounter: Payer: Self-pay | Admitting: Physical Therapy

## 2021-10-12 ENCOUNTER — Ambulatory Visit: Payer: 59 | Admitting: Physical Therapy

## 2021-10-12 DIAGNOSIS — G8929 Other chronic pain: Secondary | ICD-10-CM

## 2021-10-12 DIAGNOSIS — S39012A Strain of muscle, fascia and tendon of lower back, initial encounter: Secondary | ICD-10-CM | POA: Diagnosis not present

## 2021-10-12 NOTE — Therapy (Signed)
OUTPATIENT PHYSICAL THERAPY THORACOLUMBAR PROGRESS NOTE   Patient Name: Kevin Lane MRN: [id number] DOB:Nov 28, 2000, 21 y.o., male Today's Date: 10/12/2021   PT End of Session - 10/05/21 1230     Visit Number 2    Date for PT Re-Evaluation 10/28/21    Authorization Type [facility]    Authorization Time Period 09/27/21 to 10/28/21    PT Start Time 1231    PT Stop Time 1320    PT Time Calculation (min) 49 min    Activity Tolerance Patient tolerated treatment well;No increased pain             Past Medical History:  Diagnosis Date   Allergy    Depression with anxiety April 2015   History reviewed. No pertinent surgical history. 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   Irritation of right ulnar nerve 12/23/2018   Encounter for routine child health examination without abnormal findings 10/24/2016   BMI (body mass index), pediatric, 5% to less than 85% for age 84/09/2013   Depression with anxiety 06/24/2013   Inflammatory acne 06/24/2013    PCP: [facility]   REFERRING PROVIDER: Norton Blizzard, MD  REFERRING DIAG: Strain of lumbar region, initial encounter  Rationale for Evaluation and Treatment Rehabilitation  THERAPY DIAG:  Chronic bilateral low back pain without sciatica  ONSET DATE: atleast 1 year ago  SUBJECTIVE:                                                                                                                                                                                           SUBJECTIVE STATEMENT:  I do think the DN helped last time but I'm not ready to try it again.  I pulled my back 2 days ago and pain persists Lt>Rt today.  I do think that doing the exercises from my HEP helps my pain.  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 Yes : NPRS scale: 3-4/10 Pain location: low left lumbar spine Pain description: dull/achey Aggravating factors: quick movements, twisting while reaching overhead, and flexing the spine  Relieving factors: sidelying position   PRECAUTIONS: None  WEIGHT BEARING RESTRICTIONS No  FALLS:  Has patient fallen in last 6 months? No  LIVING ENVIRONMENT: Lives with: lives with their family Lives in: House/apartment    OCCUPATION: drawing at a desk, sometimes teaching at a college   PLOF: Independent  PATIENT GOALS improve pain with  daily activity   OBJECTIVE:   DIAGNOSTIC FINDINGS:  None   PATIENT SURVEYS:  FOTO: 47  SCREENING FOR RED FLAGS: Bowel or bladder incontinence: No Spinal tumors: No Cauda equina syndrome: No Compression fracture: No Abdominal aneurysm: No  COGNITION:  Overall cognitive status: Within functional limits for tasks assessed     SENSATION: Not tested- denies numbness/tingling   MUSCLE LENGTH: Not tested   POSTURE: rounded shoulders, decreased lumbar lordosis, and posterior pelvic tilt (seated)  PALPATION: Tenderness Lt low lumbar paraspinals   LUMBAR ROM:   Active  A/PROM  eval  Flexion WNL  Extension WNL- pain with end range PROM also painful end range   Right lateral flexion   Left lateral flexion   Right rotation WNL, pain free  Left rotation WNL, pain free    (Blank rows = not tested)  LOWER EXTREMITY ROM:     Active  Right eval Left eval  Hip flexion    Hip extension    Hip abduction    Hip adduction    Hip internal rotation    Hip external rotation    Knee flexion    Knee extension    Ankle dorsiflexion    Ankle plantarflexion    Ankle inversion    Ankle eversion     (Blank rows = not tested)  LOWER EXTREMITY MMT:    MMT Right eval Left eval  Hip flexion    Hip extension    Hip abduction    Hip adduction    Hip internal rotation    Hip external rotation    Knee flexion    Knee  extension    Ankle dorsiflexion    Ankle plantarflexion    Ankle inversion    Ankle eversion     (Blank rows = not tested)  LUMBAR SPECIAL TESTS:  Plank on elbows/feet: increased lumbar lordosis poor lower abdominal activation x5 sec   TODAY'S TREATMENT  7/26: Trunk extension gives pain today so doesn't appear to have consistent positional preference Standing tband series: green bil row x 15, red bil shoulder ext x 15, pallof press green x 10 then stir the pot x 10 Wall push ups x 10 Fwd step up 6" step march to 3rd step x 10 each LE Squat with 5lb kbell to stool x 10 Cat/cow x 12 reps SL hip abd 2x5 bil Dead bugs to fatigue 1 round (review from HEP) Updated HEP  7/19: Review of initial HEP Use of lumbar roll when sitting, discussion of limitation on sitting, stand up desks Standing extension: hands behind back, in front of counter, against wall Given info on home TENS unit and set up Discussed treatment options and pt expresses interest in try DN Manual therapy: soft tissue mobilization to bil lumbar paraspinals Trigger Point Dry-Needling  Treatment instructions: Expect mild to moderate muscle soreness. S/S of pneumothorax if dry needled over a lung field, and to seek immediate medical attention should they occur. Patient verbalized understanding of these instructions and education.  Patient Consent Given: Yes Education handout provided: Yes Muscles treated: left lumbar multifidi only Treatment response/outcome: the patient became nauseated and reports needles are quite painful, needles removed and had patient roll supine with LEs elevated on bolster, cold washcloth for 10 minutes, transitioned to sitting pt reports he is feeling much better and thinks he was anticipating the needles too much   Eval: Plank elbows/feet: 5x5 sec hold- PT cuing to adjust excessive lumbar lordosis  Deadbug 2x5 reps each side- PT  cuing to maintain proper spinal posture   PATIENT EDUCATION:   Education details: eval findings/POC; HEP; TENs info  Person educated: Patient Education method: Explanation, Tactile cues, Verbal cues, and Handouts Education comprehension: verbalized understanding and returned demonstration   HOME EXERCISE PROGRAM: Access Code: [email or web address] URL: [email or web address] Date: 10/12/2021 Prepared by: Loistine Simas Kaleeah Gingerich  Exercises - Standard Plank  - 2 x daily - 7 x weekly - 5-10 reps - 5-20 seconds hold - Supine Dead Bug with Leg Extension  - 2 x daily - 7 x weekly - 3 sets - 5-10 reps - Sidelying Hip Abduction  - 1 x daily - 7 x weekly - 2 sets - 5 reps - Standing Row with Anchored Resistance  - 1 x daily - 7 x weekly - 2 sets - 10 reps - Shoulder extension with resistance - Neutral  - 1 x daily - 7 x weekly - 2 sets - 10 reps - Cat Cow  - 1 x daily - 7 x weekly - 1 sets - 10 reps - 5 hold   ASSESSMENT:  CLINICAL IMPRESSION: Pt reports he thinks his back is a little looser from DN but isn't interested in trying again.  He has been consistent with HEP and reports that he gets some relief with his 2 core exercises (plank, dead bug).  He states he pulled his back again 2 days ago and pain continues to persist.  He was able to perform trunk flexion today with less pain than trunk extension so does not seem to have consistent positional preference.  Pt tolerated exercise progression well today but did note muscle fatigue in lumbar region end of session without any increase in pain.  Updated HEP and discussed performing exercises to tolerance and fatigue.   OBJECTIVE IMPAIRMENTS decreased endurance, decreased strength, increased muscle spasms, improper body mechanics, postural dysfunction, and pain.   ACTIVITY LIMITATIONS bending and reach over head  PARTICIPATION LIMITATIONS: occupation  PERSONAL FACTORS Age, Past/current experiences, and Time since onset of injury/illness/exacerbation are also affecting patient's functional outcome.   REHAB  POTENTIAL: Excellent  CLINICAL DECISION MAKING: Stable/uncomplicated  EVALUATION COMPLEXITY: Low   GOALS: Goals reviewed with patient? Yes  SHORT TERM GOALS: Target date: 10/26/2021  Pt will be independent with his initial HEP to improve trunk strength. Baseline: Goal status: ongoing    LONG TERM GOALS: Target date: 11/09/2021  Pt will have pain free active and passive lumbar extension ROM. Baseline:  Goal status: INITIAL  2.  Pt will have improve trunk strength evident by his ability to maintain plank on elbows/feet for up to 20 seconds, atleast 2/3 trials, without loss of trunk stability or pain. Baseline:  Goal status: INITIAL  3.  Pt will report atleast 50% improvement in his low back discomfort with various activities throughout the week.  Baseline:  Goal status: INITIAL    PLAN: PT FREQUENCY: 1x/week  PT DURATION: 4 weeks  PLANNED INTERVENTIONS: Therapeutic exercises, Therapeutic activity, Neuromuscular re-education, Patient/Family education, Joint manipulation, Joint mobilization, Dry Needling, Spinal manipulation, Spinal mobilization, Moist heat, Taping, and Manual therapy.  PLAN FOR NEXT SESSION:  HOLD DRY NEEDLING;  manual techniques for lumbar fascial and joint mobility; progress trunk strength, try half kneel activity with proper spinal alignment-progressing to standing when able   [employer], PT 10/12/21 1:25 PM  Phone: [phone] Fax: [phone]

## 2021-10-19 ENCOUNTER — Ambulatory Visit: Payer: 59 | Attending: Family Medicine | Admitting: Physical Therapy

## 2021-10-19 ENCOUNTER — Encounter: Payer: Self-pay | Admitting: Physical Therapy

## 2021-10-19 DIAGNOSIS — M545 Low back pain, unspecified: Secondary | ICD-10-CM | POA: Insufficient documentation

## 2021-10-19 DIAGNOSIS — G8929 Other chronic pain: Secondary | ICD-10-CM | POA: Insufficient documentation

## 2021-10-19 NOTE — Therapy (Signed)
OUTPATIENT PHYSICAL THERAPY THORACOLUMBAR PROGRESS NOTE   Patient Name: Kevin Lane MRN: [id number] DOB:10/27/2000, 21 y.o., male Today's Date: 10/19/2021   PT End of Session - 10/05/21 1230     Visit Number 2    Date for PT Re-Evaluation 10/28/21    Authorization Type [employer]    Authorization Time Period 09/27/21 to 10/28/21    PT Start Time 1231    PT Stop Time 1320    PT Time Calculation (min) 49 min    Activity Tolerance Patient tolerated treatment well;No increased pain             Past Medical History:  Diagnosis Date   Allergy    Depression with anxiety April 2015   History reviewed. No pertinent surgical history. 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   Irritation of right ulnar nerve 12/23/2018   Encounter for routine child health examination without abnormal findings 10/24/2016   BMI (body mass index), pediatric, 5% to less than 85% for age 06/24/2013   Depression with anxiety 06/24/2013   Inflammatory acne 06/24/2013    PCP: [facility]   REFERRING PROVIDER: Shane Hudnall, MD  REFERRING DIAG: Strain of lumbar region, initial encounter  Rationale for Evaluation and Treatment Rehabilitation  THERAPY DIAG:  Chronic bilateral low back pain without sciatica  ONSET DATE: atleast 1 year ago  SUBJECTIVE:                                                                                                                                                                                           SUBJECTIVE STATEMENT:  I did well after last time.  Today pain is only 1-2/10.  I am consistently doing some of HEP but not all due to time.  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 Yes : NPRS scale: 1-2/10 Pain location: low left lumbar  spine Pain description: dull/achey Aggravating factors: quick movements, twisting while reaching overhead, and flexing the spine  Relieving factors: sidelying position   PRECAUTIONS: None  WEIGHT BEARING RESTRICTIONS No  FALLS:  Has patient fallen in last 6 months? No  LIVING ENVIRONMENT: Lives with: lives with their family Lives in: House/apartment    OCCUPATION: drawing at a desk, sometimes teaching at a college   PLOF: Independent  PATIENT GOALS improve pain with daily activity   OBJECTIVE:   DIAGNOSTIC FINDINGS:  None   PATIENT SURVEYS:  FOTO:   40  SCREENING FOR RED FLAGS: Bowel or bladder incontinence: No Spinal tumors: No Cauda equina syndrome: No Compression fracture: No Abdominal aneurysm: No  COGNITION:  Overall cognitive status: Within functional limits for tasks assessed     SENSATION: Not tested- denies numbness/tingling   MUSCLE LENGTH: Not tested   POSTURE: rounded shoulders, decreased lumbar lordosis, and posterior pelvic tilt (seated)  PALPATION: Tenderness Lt low lumbar paraspinals   LUMBAR ROM:   Active  A/PROM  eval  Flexion WNL  Extension WNL- pain with end range PROM also painful end range   Right lateral flexion   Left lateral flexion   Right rotation WNL, pain free  Left rotation WNL, pain free    (Blank rows = not tested)  LOWER EXTREMITY ROM:     Active  Right eval Left eval  Hip flexion    Hip extension    Hip abduction    Hip adduction    Hip internal rotation    Hip external rotation    Knee flexion    Knee extension    Ankle dorsiflexion    Ankle plantarflexion    Ankle inversion    Ankle eversion     (Blank rows = not tested)  LOWER EXTREMITY MMT:    MMT Right eval Left eval  Hip flexion    Hip extension    Hip abduction    Hip adduction    Hip internal rotation    Hip external rotation    Knee flexion    Knee extension    Ankle dorsiflexion    Ankle plantarflexion    Ankle inversion     Ankle eversion     (Blank rows = not tested)  LUMBAR SPECIAL TESTS:  Plank on elbows/feet: increased lumbar lordosis poor lower abdominal activation x5 sec   TODAY'S TREATMENT  8/2: NuStep new model L4 seat 5 x 4' Standing tband series: green bil row x 15, red bil shoulder ext x 15, pallof press green x 10 then stir the pot x 10 Wall push ups x 10 Fwd step up 6" step march to 3rd step x 10 each LE Standing on flat side of BOSU at mirror, bil arm flexion x 10, bil arms at 90 flexion simulated steering wheel Lt/Rt small range turns, mini squats x 10 Supine dead bugs x 10 each opp arm/leg Cat/cow x 10 5lb kbell squat x 10 reps  7/26: Trunk extension gives pain today so doesn't appear to have consistent positional preference Standing tband series: green bil row x 15, red bil shoulder ext x 15, pallof press green x 10 then stir the pot x 10 Wall push ups x 10 Fwd step up 6" step march to 3rd step x 10 each LE Squat with 5lb kbell to stool x 10 Cat/cow x 12 reps SL hip abd 2x5 bil Dead bugs to fatigue 1 round (review from HEP) Updated HEP  7/19: Review of initial HEP Use of lumbar roll when sitting, discussion of limitation on sitting, stand up desks Standing extension: hands behind back, in front of counter, against wall Given info on home TENS unit and set up Discussed treatment options and pt expresses interest in try DN Manual therapy: soft tissue mobilization to bil lumbar paraspinals Trigger Point Dry-Needling  Treatment instructions: Expect mild to moderate muscle soreness. S/S of pneumothorax if dry needled over a lung field, and to seek immediate medical attention should they occur. Patient verbalized understanding of these instructions and education.  Patient Consent  Given: Yes Education handout provided: Yes Muscles treated: left lumbar multifidi only Treatment response/outcome: the patient became nauseated and reports needles are quite painful, needles removed and  had patient roll supine with LEs elevated on bolster, cold washcloth for 10 minutes, transitioned to sitting pt reports he is feeling much better and thinks he was anticipating the needles too much   Eval: Plank elbows/feet: 5x5 sec hold- PT cuing to adjust excessive lumbar lordosis  Deadbug 2x5 reps each side- PT cuing to maintain proper spinal posture   PATIENT EDUCATION:  Education details: eval findings/POC; HEP; TENs info  Person educated: Patient Education method: Explanation, Tactile cues, Verbal cues, and Handouts Education comprehension: verbalized understanding and returned demonstration   HOME EXERCISE PROGRAM: Access Code: [email or web address] URL: [email or web address] Date: 10/12/2021 Prepared by: Venetia Night Sadia Belfiore  Exercises - Standard Plank  - 2 x daily - 7 x weekly - 5-10 reps - 5-20 seconds hold - Supine Dead Bug with Leg Extension  - 2 x daily - 7 x weekly - 3 sets - 5-10 reps - Sidelying Hip Abduction  - 1 x daily - 7 x weekly - 2 sets - 5 reps - Standing Row with Anchored Resistance  - 1 x daily - 7 x weekly - 2 sets - 10 reps - Shoulder extension with resistance - Neutral  - 1 x daily - 7 x weekly - 2 sets - 10 reps - Cat Cow  - 1 x daily - 7 x weekly - 1 sets - 10 reps - 5 hold   ASSESSMENT:  CLINICAL IMPRESSION: Pt reports with reduced pain 0-1/10. He no longer feels at risk of "pulling his back" when standing and stepping into pants. He demos full trunk flexion and extension with 2-3/10 pain with extension, 0-1/10 flexion.  Overall he feels 10-20% improvement since starting PT.  He is working on finding time to do new updates to HEP last time but does consistently work on plank and dead bugs. He needs intermittent cues for form to keep ribcage knitted with standing UE tband.  ERO next visit with extension anticipated given good ongoing progress.   OBJECTIVE IMPAIRMENTS decreased endurance, decreased strength, increased muscle spasms, improper body  mechanics, postural dysfunction, and pain.   ACTIVITY LIMITATIONS bending and reach over head  PARTICIPATION LIMITATIONS: occupation  PERSONAL FACTORS Age, Past/current experiences, and Time since onset of injury/illness/exacerbation are also affecting patient's functional outcome.   REHAB POTENTIAL: Excellent  CLINICAL DECISION MAKING: Stable/uncomplicated  EVALUATION COMPLEXITY: Low   GOALS: Goals reviewed with patient? Yes  SHORT TERM GOALS: Target date: 11/02/2021  Pt will be independent with his initial HEP to improve trunk strength. Baseline: Goal status: met    LONG TERM GOALS: Target date: 11/16/2021  Pt will have pain free active and passive lumbar extension ROM. Baseline: 0-1/10 flexion (full), 2-3/10 extension (full) Goal status: ongoing  2.  Pt will have improve trunk strength evident by his ability to maintain plank on elbows/feet for up to 20 seconds, atleast 2/3 trials, without loss of trunk stability or pain. Baseline:  Goal status: INITIAL  3.  Pt will report atleast 50% improvement in his low back discomfort with various activities throughout the week.  Baseline: 10-20% Goal status: ongoing    PLAN: PT FREQUENCY: 1x/week  PT DURATION: 4 weeks  PLANNED INTERVENTIONS: Therapeutic exercises, Therapeutic activity, Neuromuscular re-education, Patient/Family education, Joint manipulation, Joint mobilization, Dry Needling, Spinal manipulation, Spinal mobilization, Moist heat, Taping, and Manual therapy.  PLAN FOR NEXT SESSION:  HOLD  DRY NEEDLING; ERO with extension anticipated   Johanna Beuhring, PT 10/19/21 12:33 PM   Phone: [phone] Fax: [phone]  

## 2021-10-25 ENCOUNTER — Other Ambulatory Visit: Payer: Self-pay

## 2021-10-25 ENCOUNTER — Encounter: Payer: Self-pay | Admitting: Physical Therapy

## 2021-10-25 ENCOUNTER — Ambulatory Visit: Payer: 59 | Admitting: Physical Therapy

## 2021-10-25 DIAGNOSIS — G8929 Other chronic pain: Secondary | ICD-10-CM

## 2021-10-25 DIAGNOSIS — M545 Low back pain, unspecified: Secondary | ICD-10-CM | POA: Diagnosis not present

## 2021-10-25 NOTE — Therapy (Signed)
OUTPATIENT PHYSICAL THERAPY TREATMENT NOTE/Re-eva;   Patient Name: Kevin Lane MRN: [id number] DOB:04-Jul-2000, 21 y.o., male  Today's Date: 10/26/2021  PT End of Session - 10/05/21 1230       Visit Number 5    Date for PT Re-Evaluation 11/23/21     Authorization Type [facility]     Authorization Time Period 10/29/21 to 12/07/21     PT Start Time 1445    PT Stop Time 1532     PT Time Calculation (min) 47 min     Activity Tolerance Patient tolerated treatment well;No increased pain         Past Medical History:  Diagnosis Date   Allergy    Depression with anxiety April 2015   History reviewed. No pertinent surgical history. 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   Irritation of right ulnar nerve 12/23/2018   Encounter for routine child health examination without abnormal findings 10/24/2016   BMI (body mass index), pediatric, 5% to less than 85% for age 22/09/2013   Depression with anxiety 06/24/2013   Inflammatory acne 06/24/2013   PCP: [facility]    REFERRING PROVIDER: Karlton Lemon, MD   REFERRING DIAG: Strain of lumbar region, initial encounter   Rationale for Evaluation and Treatment Rehabilitation   THERAPY DIAG:  Chronic bilateral low back pain without sciatica   ONSET DATE: atleast 1 year ago   SUBJECTIVE:                                                                                                                                                                                            SUBJECTIVE STATEMENT:  Pt states that things are going well. He still has pain throughout the day, but he has not had a big episode since starting PT.  He is doing his HEP without issues. His pain frequency has improved but the intensity seems to be about the same.  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 Yes : NPRS scale: 1-2/10 Pain location: low left lumbar spine Pain description: dull/achey Aggravating factors: quick movements, sometimes when waking up in the morning Relieving factors: sidelying position, standing up      PRECAUTIONS: None   WEIGHT BEARING RESTRICTIONS No   FALLS:  Has patient fallen in last 6 months? No   LIVING ENVIRONMENT: Lives with: lives with their family Lives  in: House/apartment      OCCUPATION: drawing at a desk, sometimes teaching at a college    PLOF: Independent   PATIENT GOALS improve pain with daily activity     OBJECTIVE:    DIAGNOSTIC FINDINGS:  None    PATIENT SURVEYS:  FOTO: 47 10/25/21: 84   SCREENING FOR RED FLAGS: Bowel or bladder incontinence: No Spinal tumors: No Cauda equina syndrome: No Compression fracture: No Abdominal aneurysm: No   COGNITION:           Overall cognitive status: Within functional limits for tasks assessed                          SENSATION: Not tested- denies numbness/tingling    MUSCLE LENGTH: Not tested    POSTURE: rounded shoulders, decreased lumbar lordosis, and posterior pelvic tilt (seated)   PALPATION: Palpable muscle spasm Lt lumbar paraspinals, no specific tenderness noted    LUMBAR ROM:    Active  A/PROM  eval  Flexion WNL  Extension WNL- pain with end range   Right lateral flexion    Left lateral flexion    Right rotation WNL, pain free  Left rotation WNL, pain free    (Blank rows = not tested)   LOWER EXTREMITY ROM:      Active  Right eval Left eval  Hip flexion      Hip extension      Hip abduction      Hip adduction      Hip internal rotation      Hip external rotation      Knee flexion      Knee extension      Ankle dorsiflexion      Ankle plantarflexion      Ankle inversion      Ankle eversion       (Blank rows = not tested)   LOWER EXTREMITY MMT:     MMT  Right eval Left eval  Hip flexion      Hip extension      Hip abduction      Hip adduction      Hip internal rotation      Hip external rotation      Knee flexion      Knee extension      Ankle dorsiflexion      Ankle plantarflexion      Ankle inversion      Ankle eversion       (Blank rows = not tested)   LUMBAR SPECIAL TESTS:  Plank on elbows/feet: PT cued pt not to lift as high, but after this pt was able to maintain proper hold for atleast 20 sec x2 trials.      TODAY'S TREATMENT  10/25/21: Plank elbows/feet x10 sec- PT making min corrections for lumbar control Incline plank on hands/feet with UE reach 2x5 reps each side Half kneel diagonal chop each side with double red TB x10 reps Standing diagonal lift each side with double ted TB x10 reps   8/2: NuStep new model L4 seat 5 x 4' Standing tband series: green bil row x 15, red bil shoulder ext x 15, pallof press green x 10 then stir the pot x 10 Wall push ups x 10 Fwd step up 6" step march to 3rd step x 10 each LE Standing on flat side of BOSU at mirror, bil arm flexion x 10, bil arms at 90 flexion simulated steering wheel Lt/Rt small  range turns, mini squats x 10 Supine dead bugs x 10 each opp arm/leg Cat/cow x 10 5lb kbell squat x 10 reps     PATIENT EDUCATION:  Education details: improvements-progress towards goals/POC; HEP 4 days a week; posture awareness throughout the day Person educated: Patient Education method: Explanation, Tactile cues, Verbal cues, and Handouts Education comprehension: verbalized understanding and returned demonstration     HOME EXERCISE PROGRAM: Access Code: [email or web address] URL: [email or web address] Date: 10/25/2021 Prepared by: [facility]  Exercises - Supine Dead Bug with Leg Extension  - 2 x daily - 7 x weekly - 3 sets - 5-10 reps - Shoulder extension with resistance - Neutral  - 1 x daily - 7 x weekly - 2 sets - 10 reps -  Cat Cow  - 1 x daily - 7 x weekly - 1 sets - 10 reps - 5 hold - Full Plank with Shoulder Taps  - 2 x daily - 4 x weekly - 1 sets - 10 reps - Half Kneeling Diagonal Chops with Resistance  - 1 x daily - 5 x weekly - 2 sets - 10 reps - Standing Diagonal Lift with Anchored Resistance  - 1 x daily - 7 x weekly - 3 sets - 10 reps       ASSESSMENT:   CLINICAL IMPRESSION: Pt has made steady progress towards his strength and pain goals. His pain frequency has improved and he has not had any severe pain episodes since beginning PT. His trunk strength is improved, evident by his ability to hold plank for greater than 20 sec, compared to at eval where he was unable to do this correctly. Although he does require PT cuing for proper posture/technique with exercises, he is able to make quicker adjustments and self correct with primarily verbal cues. Pt still has some discomfort with active lumbar extension. He also demonstrates lack of trunk coordination with standing posture and more advanced exercises/movements, particularly involving rotation. He would continue to benefit from skilled PT to address his remaining trunk strength and coordination impairments in order to decrease his risk of back injury and improve his quality of life.       OBJECTIVE IMPAIRMENTS decreased endurance, decreased strength, increased muscle spasms, improper body mechanics, postural dysfunction, and pain.    ACTIVITY LIMITATIONS bending and reach over head   PARTICIPATION LIMITATIONS: occupation   PERSONAL FACTORS Age, Past/current experiences, and Time since onset of injury/illness/exacerbation are also affecting patient's functional outcome.    REHAB POTENTIAL: Excellent   CLINICAL DECISION MAKING: Stable/uncomplicated   EVALUATION COMPLEXITY: Low     GOALS: Goals reviewed with patient? Yes   SHORT TERM GOALS: Target date: 11/02/2021   Pt will be independent with his initial HEP to improve trunk  strength. Baseline: Goal status: met       LONG TERM GOALS: Target date: 11/16/2021   Pt will have pain free active and passive lumbar extension ROM. Baseline: 0-1/10 flexion (full), 2-3/10 extension (full) Goal status: ongoing   2.  Pt will have improve trunk strength evident by his ability to maintain plank on elbows/feet for up to 20 seconds, atleast 2/3 trials, without loss of trunk stability or pain. Baseline:  Goal status: MET   3.  Pt will report atleast 50% improvement in his low back discomfort with various activities throughout the week.  Baseline: 10-20% Goal status: ongoing  4. Pt will be able to demonstrate proper posture/alignment in standing and sitting without the  need for PT cuing in order to decrease excess strain on his low back throughout the day.  Baseline: requires cuing-verbal  Goal status: NEW         PLAN: PT FREQUENCY: 1x/week   PT DURATION: 6 weeks   PLANNED INTERVENTIONS: Therapeutic exercises, Therapeutic activity, Neuromuscular re-education, Patient/Family education, Joint manipulation, Joint mobilization, Dry Needling, Spinal manipulation, Spinal mobilization, Moist heat, Taping, and Manual therapy.   PLAN FOR NEXT SESSION:  HOLD DRY NEEDLING; progress chop/lift diagonal exercises to increase speed, resistance, etc. Progress deadbug exercise if able, STM lumbar paraspinals if needed-if still noticing muscle spasm, give some low back stretches for home relief    11:00 AM,10/26/21 Sherol Dade PT, [place] [facility] [place]

## 2021-11-03 ENCOUNTER — Ambulatory Visit: Payer: 59 | Admitting: Physical Therapy

## 2021-11-03 DIAGNOSIS — M545 Low back pain, unspecified: Secondary | ICD-10-CM | POA: Diagnosis not present

## 2021-11-03 DIAGNOSIS — G8929 Other chronic pain: Secondary | ICD-10-CM

## 2021-11-03 NOTE — Therapy (Signed)
OUTPATIENT PHYSICAL THERAPY TREATMENT NOTE/Re-eva;   Patient Name: Kevin Lane MRN: [id number] DOB:2001-03-13, 21 y.o., male  Today's Date: 11/03/2021   PT End of Session - 11/03/21 1447     Visit Number 6    Date for PT Re-Evaluation 11/23/21    Authorization Type [employer] [facility]    Authorization Time Period 10/29/21 to 12/07/21    PT Start Time 1447    PT Stop Time 1529    PT Time Calculation (min) 42 min    Activity Tolerance Patient tolerated treatment well               Past Medical History:  Diagnosis Date   Allergy    Depression with anxiety April 2015   No past surgical history on file. 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   Irritation of right ulnar nerve 12/23/2018   Encounter for routine child health examination without abnormal findings 10/24/2016   BMI (body mass index), pediatric, 5% to less than 85% for age 52/09/2013   Depression with anxiety 06/24/2013   Inflammatory acne 06/24/2013   PCP: [facility]    REFERRING PROVIDER: Karlton Lemon, MD   REFERRING DIAG: Strain of lumbar region, initial encounter   Rationale for Evaluation and Treatment Rehabilitation   THERAPY DIAG:  Chronic bilateral low back pain without sciatica   ONSET DATE: atleast 1 year ago   SUBJECTIVE:                                                                                                                                                                                            SUBJECTIVE STATEMENT:  I'm do my exercises every day.  The arm raise with planks are hard on my shoulders.  The tightness in my neck (upper trap region) really bothers me with drawing.     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 Yes : NPRS scale: 1-2/10 Pain  location: upper trap left  Pain description: dull/achey Aggravating factors: quick movements, sometimes when waking up in the morning Relieving factors: sidelying position, standing up      PRECAUTIONS: None   WEIGHT BEARING RESTRICTIONS No   FALLS:  Has patient fallen in last 6 months? No   LIVING ENVIRONMENT: Lives with: lives with their family Lives in: House/apartment      OCCUPATION: drawing at a desk, sometimes teaching at a college    PLOF:  Independent   PATIENT GOALS improve pain with daily activity     OBJECTIVE:    DIAGNOSTIC FINDINGS:  None    PATIENT SURVEYS:  FOTO: 47 10/25/21: 48   SCREENING FOR RED FLAGS: Bowel or bladder incontinence: No Spinal tumors: No Cauda equina syndrome: No Compression fracture: No Abdominal aneurysm: No   COGNITION:           Overall cognitive status: Within functional limits for tasks assessed                          SENSATION: Not tested- denies numbness/tingling    MUSCLE LENGTH: Not tested    POSTURE: rounded shoulders, decreased lumbar lordosis, and posterior pelvic tilt (seated)   PALPATION: Palpable muscle spasm Lt lumbar paraspinals, no specific tenderness noted    LUMBAR ROM:    Active  A/PROM  eval  Flexion WNL  Extension WNL- pain with end range   Right lateral flexion    Left lateral flexion    Right rotation WNL, pain free  Left rotation WNL, pain free    (Blank rows = not tested)   LOWER EXTREMITY ROM:      Active  Right eval Left eval  Hip flexion      Hip extension      Hip abduction      Hip adduction      Hip internal rotation      Hip external rotation      Knee flexion      Knee extension      Ankle dorsiflexion      Ankle plantarflexion      Ankle inversion      Ankle eversion       (Blank rows = not tested)   LOWER EXTREMITY MMT:     MMT Right eval Left eval  Hip flexion      Hip extension      Hip abduction      Hip adduction      Hip internal rotation       Hip external rotation      Knee flexion      Knee extension      Ankle dorsiflexion      Ankle plantarflexion      Ankle inversion      Ankle eversion       (Blank rows = not tested)   LUMBAR SPECIAL TESTS:  Plank on elbows/feet: PT cued pt not to lift as high, but after this pt was able to maintain proper hold for atleast 20 sec x2 trials.      TODAY'S TREATMENT  8/17:  Discussed use of massage gun, Theracane, lacrosse ball; partner lean with forearms on upper traps for trigger point release Standing lat bar 35# 15x  Half kneel with green band horizontal abduction 10x, overhead elevation 10x Half kneel with green band trunk rotation 10x right/left Tall kneel with green band ABCs (half of alphabet in each direction) SLS with stir the pot each side  Discussed plank modifications secondary to shoulder pain: tall plank on mat table with alternating finger to toe taps 10x each side      10/25/21: Plank elbows/feet x10 sec- PT making min corrections for lumbar control Incline plank on hands/feet with UE reach 2x5 reps each side Half kneel diagonal chop each side with double red TB x10 reps Standing diagonal lift each side with double ted TB x10 reps   8/2: NuStep new  model L4 seat 5 x 4' Standing tband series: green bil row x 15, red bil shoulder ext x 15, pallof press green x 10 then stir the pot x 10 Wall push ups x 10 Fwd step up 6" step march to 3rd step x 10 each LE Standing on flat side of BOSU at mirror, bil arm flexion x 10, bil arms at 90 flexion simulated steering wheel Lt/Rt small range turns, mini squats x 10 Supine dead bugs x 10 each opp arm/leg Cat/cow x 10 5lb kbell squat x 10 reps     PATIENT EDUCATION:  Education details: improvements-progress towards goals/POC; HEP 4 days a week; posture awareness throughout the day Person educated: Patient Education method: Explanation, Corporate treasurer cues, Verbal cues, and Handouts Education comprehension: verbalized  understanding and returned demonstration     HOME EXERCISE PROGRAM:  Access Code: [email or web address] URL: [email or web address] Date: 11/03/2021 Prepared by: Ruben Im  Exercises - Supine Dead Bug with Leg Extension  - 2 x daily - 7 x weekly - 3 sets - 5-10 reps - Shoulder extension with resistance - Neutral  - 1 x daily - 7 x weekly - 2 sets - 10 reps - Cat Cow  - 1 x daily - 7 x weekly - 1 sets - 10 reps - 5 hold - Full Plank with Shoulder Taps  - 2 x daily - 4 x weekly - 1 sets - 10 reps - Half Kneeling Diagonal Chops with Resistance  - 1 x daily - 5 x weekly - 2 sets - 10 reps - Standing Diagonal Lift with Anchored Resistance  - 1 x daily - 7 x weekly - 3 sets - 10 reps - Half Kneeling Anti-Rotation Press - Forward Leg Opposite Anchor Side  - 1 x daily - 7 x weekly - 1 sets - 10 reps     ASSESSMENT:   CLINICAL IMPRESSION: Good response to lumbo/pelvic/hip core strengthening in 1/2 kneeling and tall kneeling positions which challenged him quite well.  Modifications needed with plank ex secondary to shoulder pain and limited overhead ex's secondary to shoulder pain as well.  Discussed trigger point release strategies for upper trap.  Therapist monitoring response and modifying treatment accordingly.       OBJECTIVE IMPAIRMENTS decreased endurance, decreased strength, increased muscle spasms, improper body mechanics, postural dysfunction, and pain.    ACTIVITY LIMITATIONS bending and reach over head   PARTICIPATION LIMITATIONS: occupation   PERSONAL FACTORS Age, Past/current experiences, and Time since onset of injury/illness/exacerbation are also affecting patient's functional outcome.    REHAB POTENTIAL: Excellent   CLINICAL DECISION MAKING: Stable/uncomplicated   EVALUATION COMPLEXITY: Low     GOALS: Goals reviewed with patient? Yes   SHORT TERM GOALS: Target date: 11/02/2021   Pt will be independent with his initial HEP to improve trunk  strength. Baseline: Goal status: met       LONG TERM GOALS: Target date: 11/16/2021   Pt will have pain free active and passive lumbar extension ROM. Baseline: 0-1/10 flexion (full), 2-3/10 extension (full) Goal status: ongoing   2.  Pt will have improve trunk strength evident by his ability to maintain plank on elbows/feet for up to 20 seconds, atleast 2/3 trials, without loss of trunk stability or pain. Baseline:  Goal status: MET   3.  Pt will report atleast 50% improvement in his low back discomfort with various activities throughout the week.  Baseline: 10-20% Goal status: ongoing  4. Pt will be able to demonstrate proper posture/alignment in  standing and sitting without the need for PT cuing in order to decrease excess strain on his low back throughout the day.  Baseline: requires cuing-verbal  Goal status: NEW         PLAN: PT FREQUENCY: 1x/week   PT DURATION: 6 weeks   PLANNED INTERVENTIONS: Therapeutic exercises, Therapeutic activity, Neuromuscular re-education, Patient/Family education, Joint manipulation, Joint mobilization, Dry Needling, Spinal manipulation, Spinal mobilization, Moist heat, Taping, and Manual therapy.   PLAN FOR NEXT SESSION:  HOLD DRY NEEDLING; 1/2 kneel and tall kneeling core progressions;  progress chop/lift diagonal exercises to increase speed, resistance, etc. Progress deadbug exercise if able, STM lumbar paraspinals if needed-if still noticing muscle spasm, give some low back stretches for home relief   Ruben Im, PT 11/03/21 5:31 PM Phone: [phone] Fax: [place] at [place]

## 2021-11-09 ENCOUNTER — Ambulatory Visit: Payer: 59 | Admitting: Physical Therapy

## 2021-11-09 ENCOUNTER — Encounter: Payer: Self-pay | Admitting: Physical Therapy

## 2021-11-09 DIAGNOSIS — G8929 Other chronic pain: Secondary | ICD-10-CM | POA: Diagnosis not present

## 2021-11-09 DIAGNOSIS — M545 Low back pain, unspecified: Secondary | ICD-10-CM | POA: Diagnosis not present

## 2021-11-09 NOTE — Therapy (Signed)
OUTPATIENT PHYSICAL THERAPY TREATMENT NOTE/Re-eva;   Patient Name: Kevin Lane MRN: [id number] DOB:15-Dec-2000, 21 y.o., male  Today's Date: 11/09/2021   PT End of Session - 11/09/21 1230     Visit Number 7    Date for PT Re-Evaluation 12/07/21    Authorization Type [employer] [facility], medical review after 25 visits    Authorization Time Period 10/29/21 to 12/07/21    Authorization - Visit Number 7    Authorization - Number of Visits 25    PT Start Time 0867    PT Stop Time 1309    PT Time Calculation (min) 39 min    Activity Tolerance Patient tolerated treatment well    Behavior During Therapy [facility] for tasks assessed/performed                Past Medical History:  Diagnosis Date   Allergy    Depression with anxiety April 2015   History reviewed. No pertinent surgical history. 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   Irritation of right ulnar nerve 12/23/2018   Encounter for routine child health examination without abnormal findings 10/24/2016   BMI (body mass index), pediatric, 5% to less than 85% for age 68/09/2013   Depression with anxiety 06/24/2013   Inflammatory acne 06/24/2013   PCP: [facility]    REFERRING PROVIDER: Karlton Lemon, MD   REFERRING DIAG: Strain of lumbar region, initial encounter   Rationale for Evaluation and Treatment Rehabilitation   THERAPY DIAG:  Chronic bilateral low back pain without sciatica   ONSET DATE: atleast 1 year ago   SUBJECTIVE:                                                                                                                                                                                            SUBJECTIVE STATEMENT:  I am doing all the new exercises and I think they are helping.     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 Yes : NPRS scale: 1-2/10 Pain location: upper trap left  Pain description: dull/achey Aggravating factors: quick movements, sometimes when waking up in the morning Relieving factors: sidelying position, standing up      PRECAUTIONS: None   WEIGHT BEARING RESTRICTIONS No   FALLS:  Has patient fallen in last 6 months? No   LIVING ENVIRONMENT: Lives with: lives with their family Lives in: House/apartment  OCCUPATION: drawing at a desk, sometimes teaching at a college    PLOF: Independent   PATIENT GOALS improve pain with daily activity     OBJECTIVE:    DIAGNOSTIC FINDINGS:  None    PATIENT SURVEYS:  FOTO: 47 10/25/21: 43   SCREENING FOR RED FLAGS: Bowel or bladder incontinence: No Spinal tumors: No Cauda equina syndrome: No Compression fracture: No Abdominal aneurysm: No   COGNITION:           Overall cognitive status: Within functional limits for tasks assessed                          SENSATION: Not tested- denies numbness/tingling    MUSCLE LENGTH: Not tested    POSTURE: rounded shoulders, decreased lumbar lordosis, and posterior pelvic tilt (seated)   PALPATION: Palpable muscle spasm Lt lumbar paraspinals, no specific tenderness noted    LUMBAR ROM:    Active  A/PROM  eval  Flexion WNL  Extension WNL- pain with end range   Right lateral flexion    Left lateral flexion    Right rotation WNL, pain free  Left rotation WNL, pain free    (Blank rows = not tested)   LOWER EXTREMITY ROM:      Active  Right eval Left eval  Hip flexion      Hip extension      Hip abduction      Hip adduction      Hip internal rotation      Hip external rotation      Knee flexion      Knee extension      Ankle dorsiflexion      Ankle plantarflexion      Ankle inversion      Ankle eversion       (Blank rows = not tested)   LOWER EXTREMITY MMT:     MMT Right eval Left eval  Hip flexion      Hip  extension      Hip abduction      Hip adduction      Hip internal rotation      Hip external rotation      Knee flexion      Knee extension      Ankle dorsiflexion      Ankle plantarflexion      Ankle inversion      Ankle eversion       (Blank rows = not tested)   LUMBAR SPECIAL TESTS:  Plank on elbows/feet: PT cued pt not to lift as high, but after this pt was able to maintain proper hold for atleast 20 sec x2 trials.      TODAY'S TREATMENT  8/23/: NuStep L5 x 5' seat 7 PT present to discuss symptoms Standing lat bar pulldown 35lb x 15 Standing blue tband ABCs (turn around at letter K) Half kneel diag chop x 10 blue band, standing lift x 10 each way blue tband Tall knees blue tband trunk rotation x 10 each way Standing squat with single arm diag dumbbell clean and press Standing deadlift holding 5lb dumbbells in bil UE x 6 reps, heavy cueing for form initially Supine large red ball handoffs UE/LE for abdominals x 6 reps Quadruped cat/cow x 5, then bird dog alt opp LE/UE x 4, VC to press into table to prevent scapular winging   8/17:  Discussed use of massage gun, Theracane, lacrosse ball; partner lean with forearms on upper  traps for trigger point release Standing lat bar 35# 15x  Half kneel with green band horizontal abduction 10x, overhead elevation 10x Half kneel with green band trunk rotation 10x right/left Tall kneel with green band ABCs (half of alphabet in each direction) SLS with stir the pot each side  Discussed plank modifications secondary to shoulder pain: tall plank on mat table with alternating finger to toe taps 10x each side  10/25/21: Plank elbows/feet x10 sec- PT making min corrections for lumbar control Incline plank on hands/feet with UE reach 2x5 reps each side Half kneel diagonal chop each side with double red TB x10 reps Standing diagonal lift each side with double ted TB x10 reps    PATIENT EDUCATION:  Education details: improvements-progress  towards goals/POC; HEP 4 days a week; posture awareness throughout the day Person educated: Patient Education method: Explanation, Corporate treasurer cues, Verbal cues, and Handouts Education comprehension: verbalized understanding and returned demonstration     HOME EXERCISE PROGRAM:  Access Code: [email or web address] URL: [email or web address] Date: 11/03/2021 Prepared by: Ruben Im  Exercises - Supine Dead Bug with Leg Extension  - 2 x daily - 7 x weekly - 3 sets - 5-10 reps - Shoulder extension with resistance - Neutral  - 1 x daily - 7 x weekly - 2 sets - 10 reps - Cat Cow  - 1 x daily - 7 x weekly - 1 sets - 10 reps - 5 hold - Full Plank with Shoulder Taps  - 2 x daily - 4 x weekly - 1 sets - 10 reps - Half Kneeling Diagonal Chops with Resistance  - 1 x daily - 5 x weekly - 2 sets - 10 reps - Standing Diagonal Lift with Anchored Resistance  - 1 x daily - 7 x weekly - 3 sets - 10 reps - Half Kneeling Anti-Rotation Press - Forward Leg Opposite Anchor Side  - 1 x daily - 7 x weekly - 1 sets - 10 reps     ASSESSMENT:   CLINICAL IMPRESSION: Pt continues to perform and tolerate tall and 1/2 kneeling tband exercises with excellent form and without pain.  He has progressed to using blue tband with these.  PT introduced higher level functional strength today with need for signif cueing for form for dead lift.  He had some mild LBP with supine ball pass between UE/LE today so reps kept to minimum.  Therapist monitoring response and modifying treatment accordingly.       OBJECTIVE IMPAIRMENTS decreased endurance, decreased strength, increased muscle spasms, improper body mechanics, postural dysfunction, and pain.    ACTIVITY LIMITATIONS bending and reach over head   PARTICIPATION LIMITATIONS: occupation   PERSONAL FACTORS Age, Past/current experiences, and Time since onset of injury/illness/exacerbation are also affecting patient's functional outcome.    REHAB POTENTIAL: Excellent    CLINICAL DECISION MAKING: Stable/uncomplicated   EVALUATION COMPLEXITY: Low     GOALS: Goals reviewed with patient? Yes   SHORT TERM GOALS: Target date: 11/02/2021   Pt will be independent with his initial HEP to improve trunk strength. Baseline: Goal status: met       LONG TERM GOALS: Target date: 11/16/2021   Pt will have pain free active and passive lumbar extension ROM. Baseline: 0-1/10 flexion (full), 2-3/10 extension (full) Goal status: ongoing   2.  Pt will have improve trunk strength evident by his ability to maintain plank on elbows/feet for up to 20 seconds, atleast 2/3 trials, without loss of trunk stability or pain. Baseline:  Goal  status: MET   3.  Pt will report atleast 50% improvement in his low back discomfort with various activities throughout the week.  Baseline: 10-20% Goal status: ongoing  4. Pt will be able to demonstrate proper posture/alignment in standing and sitting without the need for PT cuing in order to decrease excess strain on his low back throughout the day.  Baseline: requires cuing-verbal  Goal status: NEW         PLAN: PT FREQUENCY: 1x/week   PT DURATION: 6 weeks   PLANNED INTERVENTIONS: Therapeutic exercises, Therapeutic activity, Neuromuscular re-education, Patient/Family education, Joint manipulation, Joint mobilization, Dry Needling, Spinal manipulation, Spinal mobilization, Moist heat, Taping, and Manual therapy.   PLAN FOR NEXT SESSION:  HOLD DRY NEEDLING; 1/2 kneel and tall kneeling core progressions;  progress chop/lift diagonal exercises to increase speed, resistance, etc. Progress deadbug exercise if able, STM lumbar paraspinals if needed-if still noticing muscle spasm, give some low back stretches for home relief   Baruch Merl, PT 11/09/21 1:22 PM  Phone: [phone] Fax: [place] at [place]

## 2021-11-16 ENCOUNTER — Ambulatory Visit: Payer: 59 | Admitting: Family Medicine

## 2021-11-16 ENCOUNTER — Encounter: Payer: Self-pay | Admitting: Physical Therapy

## 2021-11-16 ENCOUNTER — Ambulatory Visit: Payer: 59 | Admitting: Physical Therapy

## 2021-11-16 VITALS — BP 123/77 | Ht 68.0 in | Wt 140.0 lb

## 2021-11-16 DIAGNOSIS — S161XXA Strain of muscle, fascia and tendon at neck level, initial encounter: Secondary | ICD-10-CM | POA: Diagnosis not present

## 2021-11-16 DIAGNOSIS — M545 Low back pain, unspecified: Secondary | ICD-10-CM

## 2021-11-16 DIAGNOSIS — G8929 Other chronic pain: Secondary | ICD-10-CM | POA: Diagnosis not present

## 2021-11-16 NOTE — Therapy (Signed)
OUTPATIENT PHYSICAL THERAPY TREATMENT NOTE/Re-eva;   Patient Name: Kevin Lane MRN: [id number] DOB:January 28, 2001, 21 y.o., male  Today's Date: 11/16/2021   PT End of Session - 11/16/21 1227     Visit Number 8    Date for PT Re-Evaluation 12/07/21    Authorization Type [facility], medical review after 25 visits    Authorization Time Period 10/29/21 to 12/07/21    Authorization - Visit Number 8    Authorization - Number of Visits 25    PT Start Time 1228    Activity Tolerance Patient tolerated treatment well    Behavior During Therapy [facility] for tasks assessed/performed                Past Medical History:  Diagnosis Date   Allergy    Depression with anxiety April 2015   History reviewed. No pertinent surgical history. 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   Irritation of right ulnar nerve 12/23/2018   Encounter for routine child health examination without abnormal findings 10/24/2016   BMI (body mass index), pediatric, 5% to less than 85% for age 02/23/2014   Depression with anxiety 06/24/2013   Inflammatory acne 06/24/2013   PCP: [facility]    REFERRING PROVIDER: Karlton Lemon, MD   REFERRING DIAG: Strain of lumbar region, initial encounter   Rationale for Evaluation and Treatment Rehabilitation   THERAPY DIAG:  Chronic bilateral low back pain without sciatica   ONSET DATE: atleast 1 year ago   SUBJECTIVE:                                                                                                                                                                                            SUBJECTIVE STATEMENT:  I feel like I may have done a little too much the last session but the next day I was fine. I think it was the deadlifts but am not sure.  I am having a lot of problems with my neck  and upper back and am seeing a doctor for that today (Sports Med).     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 Yes : NPRS scale: 2/10 Pain location: lumbar Pain description: dull/achey Aggravating factors: quick movements, sometimes when waking up in the morning Relieving factors: sidelying position, standing up      PRECAUTIONS: None   WEIGHT BEARING RESTRICTIONS No   FALLS:  Has patient  fallen in last 6 months? No   LIVING ENVIRONMENT: Lives with: lives with their family Lives in: House/apartment      OCCUPATION: drawing at a desk, sometimes teaching at a college    PLOF: Independent   PATIENT GOALS improve pain with daily activity     OBJECTIVE:    DIAGNOSTIC FINDINGS:  None    PATIENT SURVEYS:  FOTO: 47 10/25/21: 70   SCREENING FOR RED FLAGS: Bowel or bladder incontinence: No Spinal tumors: No Cauda equina syndrome: No Compression fracture: No Abdominal aneurysm: No   COGNITION:           Overall cognitive status: Within functional limits for tasks assessed                          SENSATION: Not tested- denies numbness/tingling    MUSCLE LENGTH: Not tested    POSTURE: rounded shoulders, decreased lumbar lordosis, and posterior pelvic tilt (seated)   PALPATION: Palpable muscle spasm Lt lumbar paraspinals, no specific tenderness noted    LUMBAR ROM:    Active  A/PROM  eval  Flexion WNL  Extension WNL- pain with end range   Right lateral flexion    Left lateral flexion    Right rotation WNL, pain free  Left rotation WNL, pain free    (Blank rows = not tested)   LOWER EXTREMITY ROM:      Active  Right eval Left eval  Hip flexion      Hip extension      Hip abduction      Hip adduction      Hip internal rotation      Hip external rotation      Knee flexion      Knee extension      Ankle dorsiflexion      Ankle plantarflexion      Ankle inversion      Ankle eversion        (Blank rows = not tested)   LOWER EXTREMITY MMT:     MMT Right eval Left eval  Hip flexion      Hip extension      Hip abduction      Hip adduction      Hip internal rotation      Hip external rotation      Knee flexion      Knee extension      Ankle dorsiflexion      Ankle plantarflexion      Ankle inversion      Ankle eversion       (Blank rows = not tested)   LUMBAR SPECIAL TESTS:  Plank on elbows/feet: PT cued pt not to lift as high, but after this pt was able to maintain proper hold for atleast 20 sec x2 trials.      TODAY'S TREATMENT  8/30: NuStep L5 x 5' seat 7 PT present to discuss symptoms Tall kneel green band trunk rotation x 10, bil Half kneel diag chop and lift x 10 each, bil SLS on airex pad with 10x stir the pot each side  Kickstand position 10lb kbell bent over row 1x10 each UE Seated deadlift 10lb x 10 (modified from standing last visit) Sidestepping and monster walks yellow loop at knees 2 laps each along 10 feet Supine deadbug from 90/90 legs 2x30" intervals (Lt neck tension) Supine hooklying single leg march to 90/90 then extend toward mat table and back, alt LE x 10 SL  clam yellow loop band x 10 each side   8/23/: NuStep L5 x 5' seat 7 PT present to discuss symptoms Standing lat bar pulldown 35lb x 15 Standing blue tband ABCs (turn around at letter K) Half kneel diag chop x 10 blue band, standing lift x 10 each way blue tband Tall knees blue tband trunk rotation x 10 each way Standing squat with single arm diag dumbbell clean and press Standing deadlift holding 5lb dumbbells in bil UE x 6 reps, heavy cueing for form initially Supine large red ball handoffs UE/LE for abdominals x 6 reps Quadruped cat/cow x 5, then bird dog alt opp LE/UE x 4, VC to press into table to prevent scapular winging   8/17:  Discussed use of massage gun, Theracane, lacrosse ball; partner lean with forearms on upper traps for trigger point release Standing lat bar  35# 15x  Half kneel with green band horizontal abduction 10x, overhead elevation 10x Half kneel with green band trunk rotation 10x right/left Tall kneel with green band ABCs (half of alphabet in each direction) SLS with stir the pot each side  Discussed plank modifications secondary to shoulder pain: tall plank on mat table with alternating finger to toe taps 10x each side   PATIENT EDUCATION:  Education details: improvements-progress towards goals/POC; HEP 4 days a week; posture awareness throughout the day Person educated: Patient Education method: Explanation, Tactile cues, Verbal cues, and Handouts Education comprehension: verbalized understanding and returned demonstration     HOME EXERCISE PROGRAM:  Access Code: [email or web address] URL: [email or web address] Date: 11/03/2021 Prepared by: Ruben Im  Exercises - Supine Dead Bug with Leg Extension  - 2 x daily - 7 x weekly - 3 sets - 5-10 reps - Shoulder extension with resistance - Neutral  - 1 x daily - 7 x weekly - 2 sets - 10 reps - Cat Cow  - 1 x daily - 7 x weekly - 1 sets - 10 reps - 5 hold - Full Plank with Shoulder Taps  - 2 x daily - 4 x weekly - 1 sets - 10 reps - Half Kneeling Diagonal Chops with Resistance  - 1 x daily - 5 x weekly - 2 sets - 10 reps - Standing Diagonal Lift with Anchored Resistance  - 1 x daily - 7 x weekly - 3 sets - 10 reps - Half Kneeling Anti-Rotation Press - Forward Leg Opposite Anchor Side  - 1 x daily - 7 x weekly - 1 sets - 10 reps     ASSESSMENT:   CLINICAL IMPRESSION: Pt plans to see Sports Med MD this afternoon for ongoing neck and upper back/shoulder pain related to knots he has which have been signif at times.  Pt felt he may have had slight increase in back pain the day of last session (dead lifts?) but was fine the next day.  Given upper body pain, PT chose more LE overlay into trunk stabilization today as a progression without aggravation of pain. If good tolerance reported  next visit will update LE (clam with loop, seated deadlift, sidestepping/monster walks with loop) therex to HEP.     OBJECTIVE IMPAIRMENTS decreased endurance, decreased strength, increased muscle spasms, improper body mechanics, postural dysfunction, and pain.    ACTIVITY LIMITATIONS bending and reach over head   PARTICIPATION LIMITATIONS: occupation   PERSONAL FACTORS Age, Past/current experiences, and Time since onset of injury/illness/exacerbation are also affecting patient's functional outcome.    REHAB POTENTIAL: Excellent   CLINICAL DECISION MAKING: Stable/uncomplicated  EVALUATION COMPLEXITY: Low     GOALS: Goals reviewed with patient? Yes   SHORT TERM GOALS: Target date: 11/02/2021   Pt will be independent with his initial HEP to improve trunk strength. Baseline: Goal status: met       LONG TERM GOALS: Target date: 11/16/2021   Pt will have pain free active and passive lumbar extension ROM. Baseline: 0-1/10 flexion (full), 2-3/10 extension (full) Goal status: ongoing   2.  Pt will have improve trunk strength evident by his ability to maintain plank on elbows/feet for up to 20 seconds, atleast 2/3 trials, without loss of trunk stability or pain. Baseline:  Goal status: MET   3.  Pt will report atleast 50% improvement in his low back discomfort with various activities throughout the week.  Baseline: 10-20% Goal status: ongoing  4. Pt will be able to demonstrate proper posture/alignment in standing and sitting without the need for PT cuing in order to decrease excess strain on his low back throughout the day.  Baseline: requires cuing-verbal  Goal status: NEW         PLAN: PT FREQUENCY: 1x/week   PT DURATION: 6 weeks   PLANNED INTERVENTIONS: Therapeutic exercises, Therapeutic activity, Neuromuscular re-education, Patient/Family education, Joint manipulation, Joint mobilization, Dry Needling, Spinal manipulation, Spinal mobilization, Moist heat, Taping, and  Manual therapy.   PLAN FOR NEXT SESSION:  progress HEP next time (see clin impression), HOLD DRY NEEDLING; 1/2 kneel and tall kneeling core progressions;  progress chop/lift diagonal exercises to increase speed, resistance, etc. Progress deadbug exercise if able, STM lumbar paraspinals if needed-if still noticing muscle spasm, give some low back stretches for home relief  Baruch Merl, PT 11/16/21 1:13 PM   Phone: [phone] Fax: [place] [facility] [place]

## 2021-11-16 NOTE — Progress Notes (Unsigned)
  Kevin Lane - 20 y.o. male MRN [id number]  Date of birth: 2000/03/27    CHIEF COMPLAINT:   neck pain    SUBJECTIVE:   HPI:  Patient comes to clinic to be evaluated for bilateral neck pain.  He reports tightness and sharp stabbing pain in his bilateral posterior neck.  He says this tightness and pain has been bothering him off and on for the last year.  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  The neck pain is really bothering him over the last few days.  He reports pain that starts at the neck and radiates up into the head and distally up to the lateral shoulders bilaterally.  He had some leftover Flexeril that he took which made his pain much better.  He has not tried any topical treatments.  He does report some numbness and tingling in the hands bilaterally, but he attributes this to his prior history of cubital tunnel and says this is not coming from the neck.   ROS:     See HPI  PERTINENT  PMH / PSH FH / / SH:  Past Medical, Surgical, Social, and Family History Reviewed & Updated in the EMR.  Pertinent findings include:  Cubital tunnel  OBJECTIVE: BP 123/77   Ht 5\' 8"  (1.727 m)   Wt 140 lb (63.5 kg)   BMI 21.29 kg/m   Physical Exam:  Vital signs are reviewed.  GEN: Alert and oriented, NAD Pulm: Breathing unlabored PSY: normal mood, congruent affect  MSK: Cervical spine:  - Inspection: no gross deformity or asymmetry, swelling or ecchymosis. No skin changes.  - Palpation: No TTP over the spinous processes, + paraspinal muscles tenderness bilat, palpable spasm R trap    - ROM: full active ROM of the cervical spine in flex/ext/SB/rotation without pain    - Strength: 5/5 strength of upper extremity in C5-T1 nerve root distributions b/l  - Neuro: sensation intact in the C5-T1 nerve root distribution b/l  - Provocative Testing: Negative Spurling's Test   ASSESSMENT & PLAN:  1. Cervical Strain, Initial  Encounter -History and exam consistent with strain of cervical paraspinal musculature.  No red flag signs or symptoms on history or exam.  He wants to start physical therapy for his neck, however he will will call his insurance company to see how many more physical therapy sessions he has left covered on his plan before starting.  He will call when he figures this out.  In the meantime, he can continue to take his Flexeril as needed for spasms.  He was also given home exercise plan to start on his own.  All questions were answered and he agrees to plan.  He can follow-up as needed.  [place], MD PGY-4, Sports Medicine Fellow [facility]

## 2021-11-16 NOTE — Patient Instructions (Addendum)
You have cervical muscle spasms You will likely benefit from taking your left over muscle relaxer, Flexeril as needed, especially at night Look into how many physical therapy visits you have left covered by your insurance plan then call us to get a physical therapy referral for cervical strain. Try the home exercises in the meantime. Follow-up with [place] as needed

## 2021-11-17 ENCOUNTER — Encounter: Payer: Self-pay | Admitting: Family Medicine

## 2021-11-17 NOTE — Addendum Note (Signed)
Addended by: Annita Brod on: 11/17/2021 11:50 AM   Modules accepted: Orders

## 2021-11-23 ENCOUNTER — Ambulatory Visit: Payer: 59 | Attending: Family Medicine | Admitting: Physical Therapy

## 2021-11-23 ENCOUNTER — Encounter: Payer: Self-pay | Admitting: Physical Therapy

## 2021-11-23 DIAGNOSIS — G8929 Other chronic pain: Secondary | ICD-10-CM | POA: Insufficient documentation

## 2021-11-23 DIAGNOSIS — M542 Cervicalgia: Secondary | ICD-10-CM | POA: Insufficient documentation

## 2021-11-23 DIAGNOSIS — M545 Low back pain, unspecified: Secondary | ICD-10-CM | POA: Insufficient documentation

## 2021-11-23 NOTE — Therapy (Signed)
OUTPATIENT PHYSICAL THERAPY TREATMENT NOTE RE-EVALUATION LUMBAR, EVALUATE CERVICAL   Patient Name: Kevin Lane MRN: [id number] DOB:2000/05/15, 21 y.o., male  Today's Date: 11/23/2021   PT End of Session - 11/23/21 1231     Visit Number 9    Date for PT Re-Evaluation 12/07/21    Authorization Type [employer] [facility], medical review after 25 visits    Authorization Time Period 10/29/21 to 12/07/21    Authorization - Visit Number 9    Authorization - Number of Visits 25    PT Start Time 1232    Activity Tolerance Patient tolerated treatment well    Behavior During Therapy [place] for tasks assessed/performed                 Past Medical History:  Diagnosis Date   Allergy    Depression with anxiety April 2015   History reviewed. No pertinent surgical history. 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   Irritation of right ulnar nerve 12/23/2018   Encounter for routine child health examination without abnormal findings 10/24/2016   BMI (body mass index), pediatric, 5% to less than 85% for age 34/09/2013   Depression with anxiety 06/24/2013   Inflammatory acne 06/24/2013   PCP: [facility]    REFERRING PROVIDER: Karlton Lemon, MD   REFERRING DIAG:  Ongoing Diagnosis: Strain of lumbar region, initial encounter New Diagnosis: S16.1XXA (ICD-10-CM) - Strain of neck muscle, initial encounter    Rationale for Evaluation and Treatment Rehabilitation   THERAPY DIAG:  Chronic bilateral low back pain without sciatica   ONSET DATE: at least 1 year ago lumbar, several months cervical/shoulders   SUBJECTIVE:                                                                                                                                                                                            SUBJECTIVE STATEMENT:  Pt  reports 50-60% improvement in LBP since starting PT.  I still feel hesitant with certain activities like bending over to dry my legs after a shower or with quicker movements.  Twisting is better now.  I feel pain sitting in the car.  Or if I catch myself in bad posture positions it can hurt.    Pt with new referral to add cervical to PT.  Pain has been ongoing for 2.5-3 years and feel like huge knots.  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  Pt has history of bil  cubital tunnel.  Massage has helped in the past for the same pain he has now.    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 Yes : NPRS scale: 0-8/10 Pain location: lumbar Pain description: dull/achey/sharp Aggravating factors: quick movements, sitting in car, bending Relieving factors: sidelying position, standing up   CERVICAL: PAIN:  Are you having pain? Yes NPRS scale: 1-7/10 Pain location: bil shoulders to base of neck, intrascapular, to deltoid tubercle  Pain orientation: Bilateral  PAIN TYPE: aching, burning, sharp, throbbing, and tight Pain description: constant  Aggravating factors: drawing, lifting or carrying Relieving factors: massage, rest, tai chi      PRECAUTIONS: None   WEIGHT BEARING RESTRICTIONS No   FALLS:  Has patient fallen in last 6 months? No   LIVING ENVIRONMENT: Lives with: lives with their family Lives in: House/apartment      OCCUPATION: drawing at a desk, sometimes teaching at a college    PLOF: Independent   PATIENT GOALS improve pain with daily activity     OBJECTIVE:    DIAGNOSTIC FINDINGS:  None    PATIENT SURVEYS:     EVAL: FOTO: 47% 10/25/21: FOTO 47% 11/23/21: FOTO    SCREENING FOR RED FLAGS: Bowel or bladder incontinence: No Spinal tumors: No Cauda equina syndrome: No Compression fracture: No Abdominal aneurysm: No   COGNITION:           Overall cognitive status: Within  functional limits for tasks assessed                          SENSATION: Not tested- denies numbness/tingling    MUSCLE LENGTH: Not tested    POSTURE: rounded shoulders, decreased lumbar lordosis, and posterior pelvic tilt (seated)   PALPATION: Palpable muscle spasm Lt lumbar paraspinals, no specific tenderness noted    LUMBAR ROM:    Active  A/PROM  eval A/ROM 9/6  Flexion WNL Full, mild pain  Extension WNL- pain with end range  Full, end range pain  Right lateral flexion   WNL with pain  Left lateral flexion   WNL with pain  Right rotation WNL, pain free Full no pain  Left rotation WNL, pain free  Full no pain   (Blank rows = not tested)   LOWER EXTREMITY STRENGTH WNL  CERVICAL ROM:  Active  A/PROM  9/6  Flexion 35  Extension  68  Right lateral flexion  45  Left lateral flexion  40  Right rotation 65  Left rotation 75   UPPER EXTREMITY STRENGTH:     Serratus anterior, middle and lower traps 3+/5 scapular winging in UE weight bearing    TODAY'S TREATMENT  9/6: Discussion of evaluation findings for cervical, plan of care plan for lumbar continuation and addition of cervical Self-care: explanation of ergonomic set up for animation drawing, education on how posture creates trigger points, add lumbar support to car   8/30: NuStep L5 x 5' seat 7 PT present to discuss symptoms Tall kneel green band trunk rotation x 10, bil Half kneel diag chop and lift x 10 each, bil SLS on airex pad with 10x stir the pot each side  Kickstand position 10lb kbell bent over row 1x10 each UE Seated deadlift 10lb x 10 (modified from standing last visit) Sidestepping and monster walks yellow loop at knees 2 laps each along 10 feet Supine deadbug from 90/90 legs 2x30" intervals (Lt neck  tension) Supine hooklying single leg march to 90/90 then extend toward mat table and back, alt LE x 10 SL clam yellow loop band x 10 each side   8/23/: NuStep L5 x 5' seat 7 PT present to  discuss symptoms Standing lat bar pulldown 35lb x 15 Standing blue tband ABCs (turn around at letter K) Half kneel diag chop x 10 blue band, standing lift x 10 each way blue tband Tall knees blue tband trunk rotation x 10 each way Standing squat with single arm diag dumbbell clean and press Standing deadlift holding 5lb dumbbells in bil UE x 6 reps, heavy cueing for form initially Supine large red ball handoffs UE/LE for abdominals x 6 reps Quadruped cat/cow x 5, then bird dog alt opp LE/UE x 4, VC to press into table to prevent scapular winging    PATIENT EDUCATION:  Education details: improvements-progress towards goals/POC; HEP 4 days a week; posture awareness throughout the day Person educated: Patient Education method: Explanation, Corporate treasurer cues, Verbal cues, and Handouts Education comprehension: verbalized understanding and returned demonstration     HOME EXERCISE PROGRAM:  Access Code: [email or web address] URL: [email or web address] Date: 11/03/2021 Prepared by: Ruben Im  Exercises - Supine Dead Bug with Leg Extension  - 2 x daily - 7 x weekly - 3 sets - 5-10 reps - Shoulder extension with resistance - Neutral  - 1 x daily - 7 x weekly - 2 sets - 10 reps - Cat Cow  - 1 x daily - 7 x weekly - 1 sets - 10 reps - 5 hold - Full Plank with Shoulder Taps  - 2 x daily - 4 x weekly - 1 sets - 10 reps - Half Kneeling Diagonal Chops with Resistance  - 1 x daily - 5 x weekly - 2 sets - 10 reps - Standing Diagonal Lift with Anchored Resistance  - 1 x daily - 7 x weekly - 3 sets - 10 reps - Half Kneeling Anti-Rotation Press - Forward Leg Opposite Anchor Side  - 1 x daily - 7 x weekly - 1 sets - 10 reps     ASSESSMENT:   CLINICAL IMPRESSION: Pt with new PT Rx to add cervical strain to treatment plan.  Pt reports significant tightness, pain and "knots" in bil shoulders and neck, worse with doing animation drawing.  He has good ROM in all planes with exception of flexion (35 deg),  with end range soft tissue tightness for SB and Rot.  He has weakness in serratus ant, middle and lower traps 3+/5.  He will benefit from education about ergo set up for drawing station, a ROM and muscle pumping routine to break up prolonged drawing posture, and strength for cervical and scapular stabilizers.   As for his lumbar spine, Pt has demo'd much improved trunk and core strength and is tolerating overlay of UE/LE strength and functional body mechanics into therex.  He still has pain with bending, sitting in car, lifting, carrying and quick movements.  Anticipate this will improve slowly with continued efforts towards strengthening, stretching and use of lumbar support in car.  We will plan to continue lumbar treatment in addition to cervical.      OBJECTIVE IMPAIRMENTS decreased endurance, decreased strength, increased muscle spasms, improper body mechanics, postural dysfunction, and pain.    ACTIVITY LIMITATIONS bending and reach over head, sitting, drawing   PARTICIPATION LIMITATIONS: occupation   PERSONAL FACTORS Age, Past/current experiences, and Time since onset of injury/illness/exacerbation are also affecting patient's functional outcome.  REHAB POTENTIAL: Excellent   CLINICAL DECISION MAKING: Stable/uncomplicated   EVALUATION COMPLEXITY: Low     GOALS: Goals reviewed with patient? Yes   SHORT TERM GOALS: Target date: 11/02/2021   Pt will be independent with his initial HEP to improve trunk strength. Baseline: Goal status: met       LONG TERM GOALS: Target date: 11/16/2021   Pt will have pain free active and passive lumbar extension ROM. Baseline: 0-1/10 flexion (full), 2-3/10 extension (full) Goal status: ongoing   2.  Pt will have improve trunk strength evident by his ability to maintain plank on elbows/feet for up to 20 seconds, atleast 2/3 trials, without loss of trunk stability or pain. Baseline:  Goal status: MET   3.  Pt will report atleast 50%  improvement in his low back discomfort with various activities throughout the week.  Baseline: 10-20% Goal status: ongoing  4. Pt will be able to demonstrate proper posture/alignment in standing and sitting without the need for PT cuing in order to decrease excess strain on his low back throughout the day.  Baseline: requires cueing-verbal  Goal status: NEW 5. Pt will demo cervical flexion at least 55 deg to demo reduced spasm.  Goal status: NEW  Baseline: 35 deg 6. Pt will report compliance with improved ergonomic set up, postural awareness, and postural therex routine every 20-30 min while drawing to manage pain with activity. 7. Pt will achieve strength of at least 4/5 in scapular stabilizers for improved postural support with drawing.         PLAN: PT FREQUENCY: 1x/week   PT DURATION: 8 weeks   PLANNED INTERVENTIONS: Therapeutic exercises, Therapeutic activity, Neuromuscular re-education, Patient/Family education, Joint manipulation, Joint mobilization, Dry Needling, Spinal manipulation, Spinal mobilization, Moist heat, Taping, and Manual therapy.   PLAN FOR NEXT SESSION:  HOLD DRY NEEDLING FOR NOW, STM to Lt upper cervicals, bil upper traps and bil levator, update HEP for seated postural relief (shoulder rolls, scap squeezes, cervical ROM, cervical stretches), review photos of drawing set up if Pt brings them on his phone and advise changes, strength for serratus ant, middle/lower traps, cervical sit ups, lumbar treatment (f/u on lumbar support in car) and see if lumbar stretches for flexion and hamstring stretches are well Erskin Burnet, PT 11/23/21 1:39 PM    Phone: [phone] Fax: [place] at [place]

## 2021-12-01 ENCOUNTER — Other Ambulatory Visit: Payer: Self-pay | Admitting: Family Medicine

## 2021-12-02 ENCOUNTER — Other Ambulatory Visit (HOSPITAL_COMMUNITY): Payer: Self-pay

## 2021-12-02 MED ORDER — CYCLOBENZAPRINE HCL 5 MG PO TABS
5.0000 mg | ORAL_TABLET | Freq: Three times a day (TID) | ORAL | 0 refills | Status: DC | PRN
  Filled 2021-12-02: qty 20, 7d supply, fill #0

## 2021-12-06 ENCOUNTER — Ambulatory Visit: Payer: 59 | Admitting: Physical Therapy

## 2021-12-06 ENCOUNTER — Encounter: Payer: Self-pay | Admitting: Physical Therapy

## 2021-12-06 DIAGNOSIS — M542 Cervicalgia: Secondary | ICD-10-CM | POA: Diagnosis not present

## 2021-12-06 DIAGNOSIS — M545 Low back pain, unspecified: Secondary | ICD-10-CM | POA: Diagnosis not present

## 2021-12-06 DIAGNOSIS — G8929 Other chronic pain: Secondary | ICD-10-CM | POA: Diagnosis not present

## 2021-12-06 NOTE — Therapy (Signed)
OUTPATIENT PHYSICAL THERAPY TREATMENT NOTE RE-EVALUATION LUMBAR, EVALUATE CERVICAL   Patient Name: Kevin Lane MRN: [id number] DOB:Dec 18, 2000, 21 y.o., male  Today's Date: 12/06/2021   PT End of Session - 12/06/21 1149     Visit Number 10    Date for PT Re-Evaluation 12/07/21    Authorization Type [employer] [facility], medical review after 25 visits    Authorization Time Period 10/29/21 to 12/07/21    Authorization - Visit Number 10    Authorization - Number of Visits 25    PT Start Time 1146    PT Stop Time 1233    PT Time Calculation (min) 47 min    Activity Tolerance Patient tolerated treatment well    Behavior During Therapy [facility] for tasks assessed/performed                  Past Medical History:  Diagnosis Date   Allergy    Depression with anxiety April 2015   History reviewed. No pertinent surgical history. 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   Irritation of right ulnar nerve 12/23/2018   Encounter for routine child health examination without abnormal findings 10/24/2016   BMI (body mass index), pediatric, 5% to less than 85% for age 27/09/2013   Depression with anxiety 06/24/2013   Inflammatory acne 06/24/2013   PCP: [facility]    REFERRING PROVIDER: Karlton Lemon, MD   REFERRING DIAG:  Ongoing Diagnosis: Strain of lumbar region, initial encounter New Diagnosis: S16.1XXA (ICD-10-CM) - Strain of neck muscle, initial encounter    Rationale for Evaluation and Treatment Rehabilitation   THERAPY DIAG:  Chronic bilateral low back pain without sciatica   ONSET DATE: at least 1 year ago lumbar, several months cervical/shoulders   SUBJECTIVE:                                                                                                                                                                                             SUBJECTIVE STATEMENT:  Pt reports adjusting his work environment to improve his posture which has  helped a lot with his cervical and thoracic pain. He reports pulling his back after last session which resulted in a few days of pain. He notes improvements in his lower back since that incident.   Pt with new referral to add cervical to PT.  Pain has been ongoing for 2.5-3 years and feel like huge knots.  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  Pt has history of bil cubital tunnel.  Massage has helped in the past for the same pain he has now.    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 Yes : NPRS scale: 0-8/10 Pain location: lumbar Pain description: dull/achey/sharp Aggravating factors: quick movements, sitting in car, bending Relieving factors: sidelying position, standing up   CERVICAL: PAIN:  Are you having pain? Yes NPRS scale: 1-7/10 Pain location: bil shoulders to base of neck, intrascapular, to deltoid tubercle  Pain orientation: Bilateral  PAIN TYPE: aching, burning, sharp, throbbing, and tight Pain description: constant  Aggravating factors: drawing, lifting or carrying Relieving factors: massage, rest, tai chi      PRECAUTIONS: None   WEIGHT BEARING RESTRICTIONS No   FALLS:  Has patient fallen in last 6 months? No   LIVING ENVIRONMENT: Lives with: lives with their family Lives in: House/apartment      OCCUPATION: drawing at a desk, sometimes teaching at a college    PLOF: Independent   PATIENT GOALS improve pain with daily activity     OBJECTIVE:    DIAGNOSTIC FINDINGS:  None    PATIENT SURVEYS:     EVAL: FOTO: 47% 10/25/21: FOTO 47% 11/23/21: FOTO    SCREENING FOR RED FLAGS: Bowel or bladder incontinence: No Spinal tumors: No Cauda equina syndrome: No Compression fracture: No Abdominal aneurysm: No   COGNITION:            Overall cognitive status: Within functional limits for tasks assessed                          SENSATION: Not tested- denies numbness/tingling    MUSCLE LENGTH: Not tested    POSTURE: rounded shoulders, decreased lumbar lordosis, and posterior pelvic tilt (seated)   PALPATION: Palpable muscle spasm Lt lumbar paraspinals, no specific tenderness noted    LUMBAR ROM:    Active  A/PROM  eval A/ROM 9/6  Flexion WNL Full, mild pain  Extension WNL- pain with end range  Full, end range pain  Right lateral flexion   WNL with pain  Left lateral flexion   WNL with pain  Right rotation WNL, pain free Full no pain  Left rotation WNL, pain free  Full no pain   (Blank rows = not tested)   LOWER EXTREMITY STRENGTH WNL  CERVICAL ROM:  Active  A/PROM  9/6  Flexion 35  Extension  68  Right lateral flexion  45  Left lateral flexion  40  Right rotation 65  Left rotation 75   UPPER EXTREMITY STRENGTH:     Serratus anterior, middle and lower traps 3+/5 scapular winging in UE weight bearing    TODAY'S TREATMENT  0919/2023:  Trigger Point Dry-Needling  Treatment instructions: Expect mild to moderate muscle soreness. S/S of pneumothorax if dry needled over a lung field, and to seek immediate medical attention should they occur. Patient verbalized understanding of these instructions and education.  Patient Consent Given: Yes Education handout provided: No Muscles treated: R UT Electrical stimulation performed: No Parameters: N/A Treatment response/outcome: Multiple twitch responses, pt reports feeling achy afterwards. Encoouraged increase intake of water, stretching and heat as needed.   ThereEx:  NuStep L5 x 5' seat 7 PT present to discuss symptoms Chin tucks x30, 3 sec hold.  UT/ LS stretch 2x 30 sec hold.  Theracane to R UT to pt tolerance.  Scap squeezes  1x 10, 3 sec hold.   Selfcare: Reviewed pt work station set up with recommendation to provide more support for his  arms.    Moist heat to cervical spine 5 min at end of session.    9/6: Discussion of evaluation findings for cervical, plan of care plan for lumbar continuation and addition of cervical Self-care: explanation of ergonomic set up for animation drawing, education on how posture creates trigger points, add lumbar support to car   8/30: NuStep L5 x 5' seat 7 PT present to discuss symptoms Tall kneel green band trunk rotation x 10, bil Half kneel diag chop and lift x 10 each, bil SLS on airex pad with 10x stir the pot each side  Kickstand position 10lb kbell bent over row 1x10 each UE Seated deadlift 10lb x 10 (modified from standing last visit) Sidestepping and monster walks yellow loop at knees 2 laps each along 10 feet Supine deadbug from 90/90 legs 2x30" intervals (Lt neck tension) Supine hooklying single leg march to 90/90 then extend toward mat table and back, alt LE x 10 SL clam yellow loop band x 10 each side   8/23/: NuStep L5 x 5' seat 7 PT present to discuss symptoms Standing lat bar pulldown 35lb x 15 Standing blue tband ABCs (turn around at letter K) Half kneel diag chop x 10 blue band, standing lift x 10 each way blue tband Tall knees blue tband trunk rotation x 10 each way Standing squat with single arm diag dumbbell clean and press Standing deadlift holding 5lb dumbbells in bil UE x 6 reps, heavy cueing for form initially Supine large red ball handoffs UE/LE for abdominals x 6 reps Quadruped cat/cow x 5, then bird dog alt opp LE/UE x 4, VC to press into table to prevent scapular winging    PATIENT EDUCATION:  Education details: improvements-progress towards goals/POC; HEP 4 days a week; posture awareness throughout the day Person educated: Patient Education method: Explanation, Corporate treasurer cues, Verbal cues, and Handouts Education comprehension: verbalized understanding and returned demonstration     HOME EXERCISE PROGRAM:  Access Code: [email or web address] URL:  [email or web address] Date: 11/03/2021 Prepared by: Ruben Im  Exercises - Supine Dead Bug with Leg Extension  - 2 x daily - 7 x weekly - 3 sets - 5-10 reps - Shoulder extension with resistance - Neutral  - 1 x daily - 7 x weekly - 2 sets - 10 reps - Cat Cow  - 1 x daily - 7 x weekly - 1 sets - 10 reps - 5 hold - Full Plank with Shoulder Taps  - 2 x daily - 4 x weekly - 1 sets - 10 reps - Half Kneeling Diagonal Chops with Resistance  - 1 x daily - 5 x weekly - 2 sets - 10 reps - Standing Diagonal Lift with Anchored Resistance  - 1 x daily - 7 x weekly - 3 sets - 10 reps - Half Kneeling Anti-Rotation Press - Forward Leg Opposite Anchor Side  - 1 x daily - 7 x weekly - 1 sets - 10 reps     ASSESSMENT:   CLINICAL IMPRESSION: Pt reports to PT with improved lower back symptoms. He states that his back as not been as bothersome as his cervical spine. Initiated session on NuStep while taking subjective. Pt asked to review work station set up with further recommendation to find forearm support. Followed up with TPDN after disucssion about previous experience. Pt responded well to TPDN to R UT with  multiple twitch responses noted. STM to R UT with passive stretching and cervical STM to paraspinals. Pt tolerated prescribed exercises well today with good forum and no reported pain. Educated pt on use of thercane since he has one at home. Ended session with moist heat for 5 min across cervical spine. Pt departs with decreased pain in his cervical spine and shoulders. Plan to incorporate paraspinal strengthening next session. Pt will continue to benefit from skilled PT to address continued deficits.      OBJECTIVE IMPAIRMENTS decreased endurance, decreased strength, increased muscle spasms, improper body mechanics, postural dysfunction, and pain.    ACTIVITY LIMITATIONS bending and reach over head, sitting, drawing   PARTICIPATION LIMITATIONS: occupation   PERSONAL FACTORS Age,  Past/current experiences, and Time since onset of injury/illness/exacerbation are also affecting patient's functional outcome.    REHAB POTENTIAL: Excellent   CLINICAL DECISION MAKING: Stable/uncomplicated   EVALUATION COMPLEXITY: Low     GOALS: Goals reviewed with patient? Yes   SHORT TERM GOALS: Target date: 11/02/2021   Pt will be independent with his initial HEP to improve trunk strength. Baseline: Goal status: met       LONG TERM GOALS: Target date: 11/16/2021   Pt will have pain free active and passive lumbar extension ROM. Baseline: 0-1/10 flexion (full), 2-3/10 extension (full) Goal status: ongoing   2.  Pt will have improve trunk strength evident by his ability to maintain plank on elbows/feet for up to 20 seconds, atleast 2/3 trials, without loss of trunk stability or pain. Baseline:  Goal status: MET   3.  Pt will report atleast 50% improvement in his low back discomfort with various activities throughout the week.  Baseline: 10-20% Goal status: ongoing  4. Pt will be able to demonstrate proper posture/alignment in standing and sitting without the need for PT cuing in order to decrease excess strain on his low back throughout the day.  Baseline: requires cueing-verbal  Goal status: NEW 5. Pt will demo cervical flexion at least 55 deg to demo reduced spasm.  Goal status: NEW  Baseline: 35 deg 6. Pt will report compliance with improved ergonomic set up, postural awareness, and postural therex routine every 20-30 min while drawing to manage pain with activity. 7. Pt will achieve strength of at least 4/5 in scapular stabilizers for improved postural support with drawing.     PLAN: PT FREQUENCY: 1x/week   PT DURATION: 8 weeks   PLANNED INTERVENTIONS: Therapeutic exercises, Therapeutic activity, Neuromuscular re-education, Patient/Family education, Joint manipulation, Joint mobilization, Dry Needling, Spinal manipulation, Spinal mobilization, Moist heat, Taping,  and Manual therapy.   PLAN FOR NEXT SESSION:  HOLD DRY NEEDLING FOR NOW, STM to Lt upper cervicals, bil upper traps and bil levator, update HEP for seated postural relief (shoulder rolls, scap squeezes, cervical ROM, cervical stretches), review photos of drawing set up if Pt brings them on his phone and advise changes, strength for serratus ant, middle/lower traps, cervical sit ups, lumbar treatment (f/u on lumbar support in car) and see if lumbar stretches for flexion and hamstring stretches are well tol  Rudi Heap PT, DPT 12/06/21  12:35 PM

## 2021-12-08 ENCOUNTER — Ambulatory Visit: Payer: 59 | Admitting: Physical Therapy

## 2021-12-13 ENCOUNTER — Ambulatory Visit: Payer: 59 | Admitting: Physical Therapy

## 2021-12-13 ENCOUNTER — Encounter: Payer: Self-pay | Admitting: Physical Therapy

## 2021-12-13 DIAGNOSIS — M419 Scoliosis, unspecified: Secondary | ICD-10-CM | POA: Diagnosis not present

## 2021-12-13 DIAGNOSIS — Z113 Encounter for screening for infections with a predominantly sexual mode of transmission: Secondary | ICD-10-CM | POA: Diagnosis not present

## 2021-12-13 DIAGNOSIS — Z1389 Encounter for screening for other disorder: Secondary | ICD-10-CM | POA: Diagnosis not present

## 2021-12-13 DIAGNOSIS — F419 Anxiety disorder, unspecified: Secondary | ICD-10-CM | POA: Diagnosis not present

## 2021-12-13 DIAGNOSIS — M545 Low back pain, unspecified: Secondary | ICD-10-CM | POA: Diagnosis not present

## 2021-12-13 DIAGNOSIS — Z1331 Encounter for screening for depression: Secondary | ICD-10-CM | POA: Diagnosis not present

## 2021-12-13 DIAGNOSIS — M542 Cervicalgia: Secondary | ICD-10-CM | POA: Diagnosis not present

## 2021-12-13 DIAGNOSIS — G8929 Other chronic pain: Secondary | ICD-10-CM | POA: Diagnosis not present

## 2021-12-13 DIAGNOSIS — L709 Acne, unspecified: Secondary | ICD-10-CM | POA: Diagnosis not present

## 2021-12-13 DIAGNOSIS — Z0000 Encounter for general adult medical examination without abnormal findings: Secondary | ICD-10-CM | POA: Diagnosis not present

## 2021-12-13 DIAGNOSIS — G5620 Lesion of ulnar nerve, unspecified upper limb: Secondary | ICD-10-CM | POA: Diagnosis not present

## 2021-12-13 NOTE — Therapy (Signed)
OUTPATIENT PHYSICAL THERAPY TREATMENT NOTE RE-EVALUATION LUMBAR, EVALUATE CERVICAL   Patient Name: Kevin Lane MRN: [id number] DOB:[birth year], 21 y.o., male  Today's Date: 12/13/2021   PT End of Session - 12/13/21 1142     Visit Number 11    Date for PT Re-Evaluation 01/18/22    Authorization Type [employer] [facility], medical review after 25 visits    Authorization Time Period through 01/18/22    Authorization - Visit Number 11    Authorization - Number of Visits 25    PT Start Time 6283    PT Stop Time 1230    PT Time Calculation (min) 46 min    Activity Tolerance Patient tolerated treatment well    Behavior During Therapy [facility] for tasks assessed/performed                   Past Medical History:  Diagnosis Date   Allergy    Depression with anxiety April 2015   History reviewed. No pertinent surgical history. 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   Irritation of right ulnar nerve 12/23/2018   Encounter for routine child health examination without abnormal findings 10/24/2016   BMI (body mass index), pediatric, 5% to less than 85% for age 83/09/2013   Depression with anxiety 06/24/2013   Inflammatory acne 06/24/2013   PCP: [facility]    REFERRING PROVIDER: Karlton Lemon, MD   REFERRING DIAG:  Ongoing Diagnosis: Strain of lumbar region, initial encounter New Diagnosis: S16.1XXA (ICD-10-CM) - Strain of neck muscle, initial encounter    Rationale for Evaluation and Treatment Rehabilitation   THERAPY DIAG:  Chronic bilateral low back pain without sciatica   ONSET DATE: at least 1 year ago lumbar, several months cervical/shoulders   SUBJECTIVE:                                                                                                                                                                                             SUBJECTIVE STATEMENT:  My new set up with drawing has helped a lot.  I am more aware of my posture overall.  I may invest in a standing desk.     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 Yes : NPRS scale: 0-8/10 Pain location: lumbar Pain description: dull/achey/sharp Aggravating factors: quick movements, sitting in car, bending Relieving factors: sidelying position, standing up   CERVICAL: PAIN:  Are  you having pain? Yes NPRS scale: 1-5/10 Pain location: bil shoulders to base of neck, intrascapular, to deltoid tubercle  Pain orientation: Bilateral  PAIN TYPE: aching, burning, sharp, throbbing, and tight Pain description: constant  Aggravating factors: drawing, lifting or carrying Relieving factors: massage, rest, tai chi      PRECAUTIONS: None   WEIGHT BEARING RESTRICTIONS No   FALLS:  Has patient fallen in last 6 months? No   LIVING ENVIRONMENT: Lives with: lives with their family Lives in: House/apartment      OCCUPATION: drawing at a desk, sometimes teaching at a college    PLOF: Independent   PATIENT GOALS improve pain with daily activity     OBJECTIVE:    DIAGNOSTIC FINDINGS:  None    PATIENT SURVEYS:   EVAL: FOTO: 47% 10/25/21: FOTO 47% 11/23/21: FOTO    SCREENING FOR RED FLAGS: Bowel or bladder incontinence: No Spinal tumors: No Cauda equina syndrome: No Compression fracture: No Abdominal aneurysm: No   COGNITION:           Overall cognitive status: Within functional limits for tasks assessed                          SENSATION: Not tested- denies numbness/tingling    MUSCLE LENGTH: Not tested    POSTURE: rounded shoulders, decreased lumbar lordosis, and posterior pelvic tilt (seated)   PALPATION: Palpable muscle spasm Lt lumbar paraspinals, no specific tenderness noted    LUMBAR ROM:    Active  A/PROM  eval A/ROM 9/6  Flexion WNL  Full, mild pain  Extension WNL- pain with end range  Full, end range pain  Right lateral flexion   WNL with pain  Left lateral flexion   WNL with pain  Right rotation WNL, pain free Full no pain  Left rotation WNL, pain free  Full no pain   (Blank rows = not tested)   LOWER EXTREMITY STRENGTH WNL  CERVICAL ROM:  Active  A/PROM  9/6  Flexion 35  Extension  68  Right lateral flexion  45  Left lateral flexion  40  Right rotation 65  Left rotation 75   UPPER EXTREMITY STRENGTH:     Serratus anterior, middle and lower traps 3+/5 scapular winging in UE weight bearing    TODAY'S TREATMENT  12/13/21: NuStep L5 x 5' PT present to monitor and discuss symptoms/progress 3-way scapular setting red loop band against wall Lower trap lift offs x 10 Serratus push ups in plank hands on chair edges x 10 Review of seated upper trap and levator stretches with overpressure, use chin tuck within overpressure, add more flexion to target upper portion of muscle Trigger Point Dry-Needling  Treatment instructions: Expect mild to moderate muscle soreness. S/S of pneumothorax if dry needled over a lung field, and to seek immediate medical attention should they occur. Patient verbalized understanding of these instructions and education. Patient Consent Given: Yes Education handout provided: Previously provided Muscles treated: bil upper traps Electrical stimulation performed: No Parameters: N/A Treatment response/outcome: very mild twitches present, much improved size of TP compared to initial visit STM in supine bil cervical paraspinals and proximal aspect of upper trap Lt>Rt  0919/2023:  Trigger Point Dry-Needling  Treatment instructions: Expect mild to moderate muscle soreness. S/S of pneumothorax if dry needled over a lung field, and to seek immediate medical attention should they occur. Patient verbalized understanding of these instructions and education.  Patient Consent Given:  Yes Education handout provided:   No Muscles treated: R UT Electrical stimulation performed: No Parameters: N/A Treatment response/outcome: Multiple twitch responses, pt reports feeling achy afterwards. Encoouraged increase intake of water, stretching and heat as needed.   ThereEx:  NuStep L5 x 5' seat 7 PT present to discuss symptoms Chin tucks x30, 3 sec hold.  UT/ LS stretch 2x 30 sec hold.  Theracane to R UT to pt tolerance.  Scap squeezes 1x 10, 3 sec hold.   Selfcare: Reviewed pt work station set up with recommendation to provide more support for his arms.    Moist heat to cervical spine 5 min at end of session.    PATIENT EDUCATION:  Education details: improvements-progress towards goals/POC; HEP 4 days a week; posture awareness throughout the day Person educated: Patient Education method: Explanation, Tactile cues, Verbal cues, and Handouts Education comprehension: verbalized understanding and returned demonstration     HOME EXERCISE PROGRAM:  Access Code: [email or web address] URL: [email or web address] Date: 11/03/2021 Prepared by: Stacy Simpson  Exercises - Supine Dead Bug with Leg Extension  - 2 x daily - 7 x weekly - 3 sets - 5-10 reps - Shoulder extension with resistance - Neutral  - 1 x daily - 7 x weekly - 2 sets - 10 reps - Cat Cow  - 1 x daily - 7 x weekly - 1 sets - 10 reps - 5 hold - Full Plank with Shoulder Taps  - 2 x daily - 4 x weekly - 1 sets - 10 reps - Half Kneeling Diagonal Chops with Resistance  - 1 x daily - 5 x weekly - 2 sets - 10 reps - Standing Diagonal Lift with Anchored Resistance  - 1 x daily - 7 x weekly - 3 sets - 10 reps - Half Kneeling Anti-Rotation Press - Forward Leg Opposite Anchor Side  - 1 x daily - 7 x weekly - 1 sets - 10 reps     ASSESSMENT:   CLINICAL IMPRESSION: Pt reports adjusted work station for animation drawing has signif helped his upper quadrant and neck pain.  He has diminished size and number of trigger points  today.  PT progressed middle trap, lower trap and serratus anterior strength today and will add to HEP next visit if well tolerated.  STM used with targeted stretching for cervical paraspinal tension today.       OBJECTIVE IMPAIRMENTS decreased endurance, decreased strength, increased muscle spasms, improper body mechanics, postural dysfunction, and pain.    ACTIVITY LIMITATIONS bending and reach over head, sitting, drawing   PARTICIPATION LIMITATIONS: occupation   PERSONAL FACTORS Age, Past/current experiences, and Time since onset of injury/illness/exacerbation are also affecting patient's functional outcome.    REHAB POTENTIAL: Excellent   CLINICAL DECISION MAKING: Stable/uncomplicated   EVALUATION COMPLEXITY: Low     GOALS: Goals reviewed with patient? Yes   SHORT TERM GOALS: Target date: 11/02/2021   Pt will be independent with his initial HEP to improve trunk strength. Baseline: Goal status: met       LONG TERM GOALS: Target date: 11/16/2021   Pt will have pain free active and passive lumbar extension ROM. Baseline: 0-1/10 flexion (full), 2-3/10 extension (full) Goal status: ongoing   2.  Pt will have improve trunk strength evident by his ability to maintain plank on elbows/feet for up to 20 seconds, atleast 2/3 trials, without loss of trunk stability or pain. Baseline:  Goal status: MET   3.  Pt will report atleast 50% improvement in his low back discomfort with various activities throughout   the week.  Baseline: 10-20% Goal status: ongoing  4. Pt will be able to demonstrate proper posture/alignment in standing and sitting without the need for PT cuing in order to decrease excess strain on his low back throughout the day.  Baseline: requires cueing-verbal  Goal status: NEW 5. Pt will demo cervical flexion at least 55 deg to demo reduced spasm.  Goal status: NEW  Baseline: 35 deg 6. Pt will report compliance with improved ergonomic set up, postural awareness, and  postural therex routine every 20-30 min while drawing to manage pain with activity. 7. Pt will achieve strength of at least 4/5 in scapular stabilizers for improved postural support with drawing.     PLAN: PT FREQUENCY: 1x/week   PT DURATION: 8 weeks   PLANNED INTERVENTIONS: Therapeutic exercises, Therapeutic activity, Neuromuscular re-education, Patient/Family education, Joint manipulation, Joint mobilization, Dry Needling, Spinal manipulation, Spinal mobilization, Moist heat, Taping, and Manual therapy.   PLAN FOR NEXT SESSION:  HOLD DRY NEEDLING FOR NOW, STM to Lt upper cervicals, bil upper traps and bil levator, update HEP for seated postural relief (shoulder rolls, scap squeezes, cervical ROM, cervical stretches), review photos of drawing set up if Pt brings them on his phone and advise changes, strength for serratus ant, middle/lower traps, cervical sit ups, lumbar treatment (f/u on lumbar support in car) and see if lumbar stretches for flexion and hamstring stretches are well tol Chele Cornell, PT 12/13/21 12:55 PM

## 2021-12-15 ENCOUNTER — Ambulatory Visit: Payer: 59 | Admitting: Physical Therapy

## 2021-12-15 ENCOUNTER — Encounter: Payer: Self-pay | Admitting: Physical Therapy

## 2021-12-15 DIAGNOSIS — M542 Cervicalgia: Secondary | ICD-10-CM

## 2021-12-15 DIAGNOSIS — K0889 Other specified disorders of teeth and supporting structures: Secondary | ICD-10-CM | POA: Diagnosis not present

## 2021-12-15 DIAGNOSIS — M545 Low back pain, unspecified: Secondary | ICD-10-CM | POA: Diagnosis not present

## 2021-12-15 DIAGNOSIS — G8929 Other chronic pain: Secondary | ICD-10-CM | POA: Diagnosis not present

## 2021-12-15 DIAGNOSIS — J320 Chronic maxillary sinusitis: Secondary | ICD-10-CM | POA: Diagnosis not present

## 2021-12-15 NOTE — Therapy (Signed)
OUTPATIENT PHYSICAL THERAPY TREATMENT NOTE RE-EVALUATION LUMBAR, EVALUATE CERVICAL   Patient Name: Kevin Lane MRN: [id number] DOB:Dec 10, 2000, 21 y.o., male  Today's Date: 12/15/2021   PT End of Session - 12/15/21 1405     Visit Number 12    Date for PT Re-Evaluation 01/18/22    Authorization Type [employer] [facility], medical review after 25 visits    Authorization Time Period through 01/18/22    Authorization - Visit Number 12    Authorization - Number of Visits 25    PT Start Time 0034    PT Stop Time 1447    PT Time Calculation (min) 45 min    Activity Tolerance Patient tolerated treatment well    Behavior During Therapy [facility] for tasks assessed/performed                   Past Medical History:  Diagnosis Date   Allergy    Depression with anxiety April 2015   History reviewed. No pertinent surgical history. 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   Irritation of right ulnar nerve 12/23/2018   Encounter for routine child health examination without abnormal findings 10/24/2016   BMI (body mass index), pediatric, 5% to less than 85% for age 11/24/2013   Depression with anxiety 06/24/2013   Inflammatory acne 06/24/2013   PCP: [facility]    REFERRING PROVIDER: Karlton Lemon, MD   REFERRING DIAG:  Ongoing Diagnosis: Strain of lumbar region, initial encounter New Diagnosis: S16.1XXA (ICD-10-CM) - Strain of neck muscle, initial encounter    Rationale for Evaluation and Treatment Rehabilitation   THERAPY DIAG:  Chronic bilateral low back pain without sciatica   ONSET DATE: at least 1 year ago lumbar, several months cervical/shoulders   SUBJECTIVE:                                                                                                                                                                                             SUBJECTIVE STATEMENT:  I pulled my back last night.  I picked my girlfriend up with an all arms approach so I think that's why. It's better today than last night.  I am doing the stretching for my neck but need to review it.   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 Yes : NPRS scale: 0-8/10 Pain location: lumbar Pain description: dull/achey/sharp Aggravating factors: quick movements, sitting in car,  bending Relieving factors: sidelying position, standing up   CERVICAL: PAIN:  Are you having pain? Yes NPRS scale: 1-5/10 Pain location: bil shoulders to base of neck, intrascapular, to deltoid tubercle  Pain orientation: Bilateral  PAIN TYPE: aching, burning, sharp, throbbing, and tight Pain description: constant  Aggravating factors: drawing, lifting or carrying Relieving factors: massage, rest, tai chi      PRECAUTIONS: None   WEIGHT BEARING RESTRICTIONS No   FALLS:  Has patient fallen in last 6 months? No   LIVING ENVIRONMENT: Lives with: lives with their family Lives in: House/apartment      OCCUPATION: drawing at a desk, sometimes teaching at a college    PLOF: Independent   PATIENT GOALS improve pain with daily activity     OBJECTIVE:    DIAGNOSTIC FINDINGS:  None    PATIENT SURVEYS:   EVAL: FOTO: 47% 10/25/21: FOTO 47% 11/23/21: FOTO    SCREENING FOR RED FLAGS: Bowel or bladder incontinence: No Spinal tumors: No Cauda equina syndrome: No Compression fracture: No Abdominal aneurysm: No   COGNITION:           Overall cognitive status: Within functional limits for tasks assessed                          SENSATION: Not tested- denies numbness/tingling    MUSCLE LENGTH: Not tested    POSTURE: rounded shoulders, decreased lumbar lordosis, and posterior pelvic tilt (seated)   PALPATION: Palpable muscle spasm Lt lumbar paraspinals, no specific  tenderness noted    LUMBAR ROM:    Active  A/PROM  eval A/ROM 9/6  Flexion WNL Full, mild pain  Extension WNL- pain with end range  Full, end range pain  Right lateral flexion   WNL with pain  Left lateral flexion   WNL with pain  Right rotation WNL, pain free Full no pain  Left rotation WNL, pain free  Full no pain   (Blank rows = not tested)   LOWER EXTREMITY STRENGTH WNL  CERVICAL ROM:  Active  A/PROM  9/6  Flexion 35  Extension  68  Right lateral flexion  45  Left lateral flexion  40  Right rotation 65  Left rotation 75   UPPER EXTREMITY STRENGTH:     Serratus anterior, middle and lower traps 3+/5 scapular winging in UE weight bearing    TODAY'S TREATMENT  12/15/21: NuStep L5 x 5' PT  Seated LT upper trap stretch with overpressure 2x30" Seated neck retraction with towel resistance 10x5" holds Supine neck sit ups x 5 reps, VC for technique Standing green tband shoulder horiz abd 1x10, bil ER 1x10, stagger stance row stand on band x 10 Standing on yellow band in stagger stance bil shoulder ext yellow band x 10 Lower trap Y lift offs from wall x 10 UBE L4 2x2 fwd/bwd PT present to monitor Seated Lt upper trap TP release and active release  12/13/21: NuStep L5 x 5' PT present to monitor and discuss symptoms/progress 3-way scapular setting red loop band against wall Lower trap lift offs x 10 Serratus push ups in plank hands on chair edges x 10 Review of seated upper trap and levator stretches with overpressure, use chin tuck within overpressure, add more flexion to target upper portion of muscle Trigger Point Dry-Needling  Treatment instructions: Expect mild to moderate muscle soreness. S/S of pneumothorax if dry needled over a lung field, and to seek immediate medical attention should they occur. Patient verbalized  understanding of these instructions and education. Patient Consent Given: Yes Education handout provided: Previously provided Muscles treated: bil  upper traps Electrical stimulation performed: No Parameters: N/A Treatment response/outcome: very mild twitches present, much improved size of TP compared to initial visit STM in supine bil cervical paraspinals and proximal aspect of upper trap Lt>Rt  0919/2023:  Trigger Point Dry-Needling  Treatment instructions: Expect mild to moderate muscle soreness. S/S of pneumothorax if dry needled over a lung field, and to seek immediate medical attention should they occur. Patient verbalized understanding of these instructions and education.  Patient Consent Given: Yes Education handout provided: No Muscles treated: R UT Electrical stimulation performed: No Parameters: N/A Treatment response/outcome: Multiple twitch responses, pt reports feeling achy afterwards. Encoouraged increase intake of water, stretching and heat as needed.   ThereEx:  NuStep L5 x 5' seat 7 PT present to discuss symptoms Chin tucks x30, 3 sec hold.  UT/ LS stretch 2x 30 sec hold.  Theracane to R UT to pt tolerance.  Scap squeezes 1x 10, 3 sec hold.   Selfcare: Reviewed pt work station set up with recommendation to provide more support for his arms.    Moist heat to cervical spine 5 min at end of session.    PATIENT EDUCATION:  Education details: improvements-progress towards goals/POC; HEP 4 days a week; posture awareness throughout the day Person educated: Patient Education method: Explanation, Tactile cues, Verbal cues, and Handouts Education comprehension: verbalized understanding and returned demonstration     HOME EXERCISE PROGRAM:  Access Code: [email or web address] URL: [email or web address] Date: 12/15/2021 Prepared by: Venetia Night Kelyn Koskela  Exercises - Supine Dead Bug with Leg Extension  - 2 x daily - 7 x weekly - 3 sets - 5-10 reps - Shoulder extension with resistance - Neutral  - 1 x daily - 7 x weekly - 2 sets - 10 reps - Cat Cow  - 1 x daily - 7 x weekly - 1 sets - 10 reps - 5 hold - Full Plank  with Shoulder Taps  - 2 x daily - 4 x weekly - 1 sets - 10 reps - Half Kneeling Diagonal Chops with Resistance  - 1 x daily - 5 x weekly - 2 sets - 10 reps - Standing Diagonal Lift with Anchored Resistance  - 1 x daily - 7 x weekly - 3 sets - 10 reps - Half Kneeling Anti-Rotation Press - Forward Leg Opposite Anchor Side  - 1 x daily - 7 x weekly - 1 sets - 10 reps - Cervical Retraction with Resistance  - 1 x daily - 7 x weekly - 1 sets - 10 reps - 5 hold - Standing Shoulder Horizontal Abduction with Resistance  - 1 x daily - 7 x weekly - 2 sets - 10 reps - Standing Bent Over Shoulder Row with Resistance  - 1 x daily - 7 x weekly - 2 sets - 10 reps - Low Trap Setting at [place]  - 1 x daily - 7 x weekly - 2 sets - 10 reps     ASSESSMENT:   CLINICAL IMPRESSION: Pt reports he pulled his back last night but is already mostly recovered today. Discussed how this is progress given his ability to more quickly recover and what his risk factors are for this occurrence (quick sudden movements, unanticipated load).  Pt continues to have reduced upper quadrant symptoms since adjusting work station.  He has ongoing spasm along Rt upper trap, supraspinatus and scalenes which have responded well to DN  and soft tissue techniques.  HEP advanced today for upper quadrant strength and stabilization.      OBJECTIVE IMPAIRMENTS decreased endurance, decreased strength, increased muscle spasms, improper body mechanics, postural dysfunction, and pain.    ACTIVITY LIMITATIONS bending and reach over head, sitting, drawing   PARTICIPATION LIMITATIONS: occupation   PERSONAL FACTORS Age, Past/current experiences, and Time since onset of injury/illness/exacerbation are also affecting patient's functional outcome.    REHAB POTENTIAL: Excellent   CLINICAL DECISION MAKING: Stable/uncomplicated   EVALUATION COMPLEXITY: Low     GOALS: Goals reviewed with patient? Yes   SHORT TERM GOALS: Target date: 11/02/2021   Pt will  be independent with his initial HEP to improve trunk strength. Baseline: Goal status: met       LONG TERM GOALS: Target date: 11/16/2021   Pt will have pain free active and passive lumbar extension ROM. Baseline: 0-1/10 flexion (full), 2-3/10 extension (full) Goal status: ongoing   2.  Pt will have improve trunk strength evident by his ability to maintain plank on elbows/feet for up to 20 seconds, atleast 2/3 trials, without loss of trunk stability or pain. Baseline:  Goal status: MET   3.  Pt will report atleast 50% improvement in his low back discomfort with various activities throughout the week.  Baseline: 10-20% Goal status: ongoing  4. Pt will be able to demonstrate proper posture/alignment in standing and sitting without the need for PT cuing in order to decrease excess strain on his low back throughout the day.  Baseline: requires cueing-verbal  Goal status: NEW 5. Pt will demo cervical flexion at least 55 deg to demo reduced spasm.  Goal status: NEW  Baseline: 35 deg 6. Pt will report compliance with improved ergonomic set up, postural awareness, and postural therex routine every 20-30 min while drawing to manage pain with activity. 7. Pt will achieve strength of at least 4/5 in scapular stabilizers for improved postural support with drawing.     PLAN: PT FREQUENCY: 1x/week   PT DURATION: 8 weeks   PLANNED INTERVENTIONS: Therapeutic exercises, Therapeutic activity, Neuromuscular re-education, Patient/Family education, Joint manipulation, Joint mobilization, Dry Needling, Spinal manipulation, Spinal mobilization, Moist heat, Taping, and Manual therapy.   PLAN FOR NEXT SESSION:  HOLD DRY NEEDLING FOR NOW, STM to Lt upper cervicals, bil upper traps and bil levator, update HEP for seated postural relief (shoulder rolls, scap squeezes, cervical ROM, cervical stretches), review photos of drawing set up if Pt brings them on his phone and advise changes, strength for serratus  ant, middle/lower traps, cervical sit ups, lumbar treatment (f/u on lumbar support in car) and see if lumbar stretches for flexion and hamstring stretches are well tol  Catherin Doorn, PT 12/15/21 3:38 PM

## 2021-12-17 ENCOUNTER — Other Ambulatory Visit: Payer: Self-pay | Admitting: Family Medicine

## 2021-12-19 ENCOUNTER — Other Ambulatory Visit (HOSPITAL_COMMUNITY): Payer: Self-pay

## 2021-12-19 MED ORDER — CYCLOBENZAPRINE HCL 5 MG PO TABS
5.0000 mg | ORAL_TABLET | Freq: Three times a day (TID) | ORAL | 0 refills | Status: DC | PRN
  Filled 2021-12-19: qty 20, 7d supply, fill #0

## 2021-12-20 ENCOUNTER — Ambulatory Visit: Payer: 59 | Attending: Family Medicine | Admitting: Physical Therapy

## 2021-12-20 ENCOUNTER — Encounter: Payer: Self-pay | Admitting: Physical Therapy

## 2021-12-20 DIAGNOSIS — M545 Low back pain, unspecified: Secondary | ICD-10-CM | POA: Diagnosis not present

## 2021-12-20 DIAGNOSIS — M542 Cervicalgia: Secondary | ICD-10-CM | POA: Insufficient documentation

## 2021-12-20 DIAGNOSIS — G8929 Other chronic pain: Secondary | ICD-10-CM | POA: Insufficient documentation

## 2021-12-20 NOTE — Therapy (Signed)
OUTPATIENT PHYSICAL THERAPY TREATMENT NOTE RE-EVALUATION LUMBAR, EVALUATE CERVICAL   Patient Name: Kevin Lane MRN: [id number] DOB:12/02/2000, 21 y.o., male  Today's Date: 12/20/2021   PT End of Session - 12/20/21 1209     Visit Number 13    Date for PT Re-Evaluation 01/18/22    Authorization Type [employer], medical review after 25 visits    Authorization Time Period through 01/18/22    Authorization - Visit Number 13    Authorization - Number of Visits 25    PT Start Time 1210    PT Stop Time 1255    PT Time Calculation (min) 45 min    Activity Tolerance Patient tolerated treatment well    Behavior During Therapy WFL for tasks assessed/performed                    Past Medical History:  Diagnosis Date   Allergy    Depression with anxiety April 2015   History reviewed. No pertinent surgical history. 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   Irritation of right ulnar nerve 12/23/2018   Encounter for routine child health examination without abnormal findings 10/24/2016   BMI (body mass index), pediatric, 5% to less than 85% for age 06/24/2013   Depression with anxiety 06/24/2013   Inflammatory acne 06/24/2013   PCP: [facility]    REFERRING PROVIDER: Shane Hudnall, MD   REFERRING DIAG:  Ongoing Diagnosis: Strain of lumbar region, initial encounter New Diagnosis: S16.1XXA (ICD-10-CM) - Strain of neck muscle, initial encounter    Rationale for Evaluation and Treatment Rehabilitation   THERAPY DIAG:  Chronic bilateral low back pain without sciatica   ONSET DATE: at least 1 year ago lumbar, several months cervical/shoulders   SUBJECTIVE:                                                                                                                                                                                             SUBJECTIVE STATEMENT:  I got a new camera last week and I think as I try to figure it out I'm putting my head at off angles.  I was in a lot of pain 2 days ago and less so yesterday.  The stretches feel good.  I would like to try the estim to see if I should get a portable unit.     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 Yes : NPRS scale: /10 Pain location: lumbar Pain description: dull/achey/sharp Aggravating factors: quick movements, sitting in car, bending Relieving factors: sidelying position, standing up   CERVICAL: PAIN:  Are you having pain? Yes NPRS scale: 3-4/10 Pain location: bil shoulders to base of neck, intrascapular, to deltoid tubercle  Pain orientation: Bilateral  PAIN TYPE: aching, burning, sharp, throbbing, and tight Pain description: constant  Aggravating factors: drawing, lifting or carrying Relieving factors: massage, rest, tai chi      PRECAUTIONS: None   WEIGHT BEARING RESTRICTIONS No   FALLS:  Has patient fallen in last 6 months? No   LIVING ENVIRONMENT: Lives with: lives with their family Lives in: House/apartment      OCCUPATION: drawing at a desk, sometimes teaching at a college    PLOF: Independent   PATIENT GOALS improve pain with daily activity     OBJECTIVE:    DIAGNOSTIC FINDINGS:  None    PATIENT SURVEYS:   EVAL: FOTO: 47% 10/25/21: FOTO 47% 11/23/21: FOTO    SCREENING FOR RED FLAGS: Bowel or bladder incontinence: No Spinal tumors: No Cauda equina syndrome: No Compression fracture: No Abdominal aneurysm: No   COGNITION:           Overall cognitive status: Within functional limits for tasks assessed                          SENSATION: Not tested- denies numbness/tingling    MUSCLE LENGTH: Not tested    POSTURE: rounded shoulders, decreased lumbar lordosis, and posterior pelvic tilt (seated)   PALPATION: Palpable  muscle spasm Lt lumbar paraspinals, no specific tenderness noted    LUMBAR ROM:    Active  A/PROM  eval A/ROM 9/6  Flexion WNL Full, mild pain  Extension WNL- pain with end range  Full, end range pain  Right lateral flexion   WNL with pain  Left lateral flexion   WNL with pain  Right rotation WNL, pain free Full no pain  Left rotation WNL, pain free  Full no pain   (Blank rows = not tested)   LOWER EXTREMITY STRENGTH WNL  CERVICAL ROM:  Active  A/PROM  9/6  Flexion 35  Extension  68  Right lateral flexion  45  Left lateral flexion  40  Right rotation 65  Left rotation 75   UPPER EXTREMITY STRENGTH:     Serratus anterior, middle and lower traps 3+/5 scapular winging in UE weight bearing    TODAY'S TREATMENT  12/20/21: UBE L3 3x3 Seated upper trap stretch seated on hand 3x10", bil Seated neck retraction with towel resistance 10x5" holds In 1/4 squat: bil shoulder extension and fly x10  Seated lower trap spread and scoop to 90/90 x 10 light green loop band Supine hooklying serratus press ups 5lb dumbbells in bil UE x 10 Supine horiz abd bil shoulders green band x 10 with neck retraction Supine neck sit ups x 6 Trial of IFC to neck x 10' end of session  12/15/21: NuStep L5 x 5' PT  Seated LT upper trap stretch with overpressure 2x30" Seated neck retraction with towel resistance 10x5" holds Supine neck sit ups x 5 reps, VC for technique Standing green tband shoulder horiz abd 1x10, bil ER 1x10, stagger stance row stand on band x 10 Standing on yellow band in stagger stance bil shoulder ext yellow band x 10 Lower trap Y lift offs from wall x 10 UBE L4 2x2 fwd/bwd   PT present to monitor Seated Lt upper trap TP release and active release  12/13/21: NuStep L5 x 5' PT present to monitor and discuss symptoms/progress 3-way scapular setting red loop band against wall Lower trap lift offs x 10 Serratus push ups in plank hands on chair edges x 10 Review of seated upper  trap and levator stretches with overpressure, use chin tuck within overpressure, add more flexion to target upper portion of muscle Trigger Point Dry-Needling  Treatment instructions: Expect mild to moderate muscle soreness. S/S of pneumothorax if dry needled over a lung field, and to seek immediate medical attention should they occur. Patient verbalized understanding of these instructions and education. Patient Consent Given: Yes Education handout provided: Previously provided Muscles treated: bil upper traps Electrical stimulation performed: No Parameters: N/A Treatment response/outcome: very mild twitches present, much improved size of TP compared to initial visit STM in supine bil cervical paraspinals and proximal aspect of upper trap Lt>Rt  0919/2023:  Trigger Point Dry-Needling  Treatment instructions: Expect mild to moderate muscle soreness. S/S of pneumothorax if dry needled over a lung field, and to seek immediate medical attention should they occur. Patient verbalized understanding of these instructions and education.  Patient Consent Given: Yes Education handout provided: No Muscles treated: R UT Electrical stimulation performed: No Parameters: N/A Treatment response/outcome: Multiple twitch responses, pt reports feeling achy afterwards. Encoouraged increase intake of water, stretching and heat as needed.   ThereEx:  NuStep L5 x 5' seat 7 PT present to discuss symptoms Chin tucks x30, 3 sec hold.  UT/ LS stretch 2x 30 sec hold.  Theracane to R UT to pt tolerance.  Scap squeezes 1x 10, 3 sec hold.   Selfcare: Reviewed pt work station set up with recommendation to provide more support for his arms.    Moist heat to cervical spine 5 min at end of session.    PATIENT EDUCATION:  Education details: improvements-progress towards goals/POC; HEP 4 days a week; posture awareness throughout the day Person educated: Patient Education method: Explanation, Tactile cues, Verbal  cues, and Handouts Education comprehension: verbalized understanding and returned demonstration     HOME EXERCISE PROGRAM:  Access Code: [email or web address] URL: [email or web address] Date: 12/15/2021 Prepared by: Johanna Beuhring  Exercises - Supine Dead Bug with Leg Extension  - 2 x daily - 7 x weekly - 3 sets - 5-10 reps - Shoulder extension with resistance - Neutral  - 1 x daily - 7 x weekly - 2 sets - 10 reps - Cat Cow  - 1 x daily - 7 x weekly - 1 sets - 10 reps - 5 hold - Full Plank with Shoulder Taps  - 2 x daily - 4 x weekly - 1 sets - 10 reps - Half Kneeling Diagonal Chops with Resistance  - 1 x daily - 5 x weekly - 2 sets - 10 reps - Standing Diagonal Lift with Anchored Resistance  - 1 x daily - 7 x weekly - 3 sets - 10 reps - Half Kneeling Anti-Rotation Press - Forward Leg Opposite Anchor Side  - 1 x daily - 7 x weekly - 1 sets - 10 reps - Cervical Retraction with Resistance  - 1 x daily - 7 x weekly - 1 sets - 10 reps - 5 hold - Standing Shoulder Horizontal Abduction with Resistance  - 1 x daily - 7 x weekly - 2 sets - 10 reps - Standing Bent Over Shoulder Row with Resistance  - 1 x daily - 7 x   weekly - 2 sets - 10 reps - Low Trap Setting at [place]  - 1 x daily - 7 x weekly - 2 sets - 10 reps     ASSESSMENT:   CLINICAL IMPRESSION: Pt reports increased pain 2 days ago after getting a new camera and holding his head at off angles while using.  He arrived with pain rating 3-4/10 but did report diminished pain following therex for stretching, stabilization and strength.  He has weakness in postural muscles with shuddering eccentric phases for shoulder strengthening.  Trial of estim performed end of session today to see if Pt wants to look into purchasing a portable stim unit.     OBJECTIVE IMPAIRMENTS decreased endurance, decreased strength, increased muscle spasms, improper body mechanics, postural dysfunction, and pain.    ACTIVITY LIMITATIONS bending and reach over head,  sitting, drawing   PARTICIPATION LIMITATIONS: occupation   PERSONAL FACTORS Age, Past/current experiences, and Time since onset of injury/illness/exacerbation are also affecting patient's functional outcome.    REHAB POTENTIAL: Excellent   CLINICAL DECISION MAKING: Stable/uncomplicated   EVALUATION COMPLEXITY: Low     GOALS: Goals reviewed with patient? Yes   SHORT TERM GOALS: Target date: 11/02/2021   Pt will be independent with his initial HEP to improve trunk strength. Baseline: Goal status: met       LONG TERM GOALS: Target date: 11/16/2021   Pt will have pain free active and passive lumbar extension ROM. Baseline: 0-1/10 flexion (full), 2-3/10 extension (full) Goal status: ongoing   2.  Pt will have improve trunk strength evident by his ability to maintain plank on elbows/feet for up to 20 seconds, atleast 2/3 trials, without loss of trunk stability or pain. Baseline:  Goal status: MET   3.  Pt will report atleast 50% improvement in his low back discomfort with various activities throughout the week.  Baseline: 10-20% Goal status: ongoing  4. Pt will be able to demonstrate proper posture/alignment in standing and sitting without the need for PT cuing in order to decrease excess strain on his low back throughout the day.  Baseline: requires cueing-verbal  Goal status: NEW 5. Pt will demo cervical flexion at least 55 deg to demo reduced spasm.  Goal status: NEW  Baseline: 35 deg 6. Pt will report compliance with improved ergonomic set up, postural awareness, and postural therex routine every 20-30 min while drawing to manage pain with activity. 7. Pt will achieve strength of at least 4/5 in scapular stabilizers for improved postural support with drawing.     PLAN: PT FREQUENCY: 1x/week   PT DURATION: 8 weeks   PLANNED INTERVENTIONS: Therapeutic exercises, Therapeutic activity, Neuromuscular re-education, Patient/Family education, Joint manipulation, Joint  mobilization, Dry Needling, Spinal manipulation, Spinal mobilization, Moist heat, Taping, and Manual therapy.   PLAN FOR NEXT SESSION:  continue manual therapy as needed, cervical and scap stabilization progression, cervical stretches, how did estim go  [employer], PT 12/20/21 12:53 PM

## 2021-12-22 ENCOUNTER — Ambulatory Visit: Payer: 59 | Admitting: Physical Therapy

## 2021-12-27 ENCOUNTER — Ambulatory Visit: Payer: 59 | Admitting: Physical Therapy

## 2021-12-27 ENCOUNTER — Encounter: Payer: Self-pay | Admitting: Physical Therapy

## 2021-12-27 DIAGNOSIS — M545 Low back pain, unspecified: Secondary | ICD-10-CM

## 2021-12-27 DIAGNOSIS — M542 Cervicalgia: Secondary | ICD-10-CM | POA: Diagnosis not present

## 2021-12-27 DIAGNOSIS — G8929 Other chronic pain: Secondary | ICD-10-CM | POA: Diagnosis not present

## 2021-12-27 NOTE — Therapy (Signed)
OUTPATIENT PHYSICAL THERAPY TREATMENT NOTE RE-EVALUATION LUMBAR, EVALUATE CERVICAL   Patient Name: Kevin Lane MRN: [id number] DOB:2000/06/20, 21 y.o., male  Today's Date: 12/27/2021   PT End of Session - 12/27/21 1229     Visit Number 14    Date for PT Re-Evaluation 01/18/22    Authorization Type [facility], medical review after 25 visits    Authorization Time Period through 01/18/22    Authorization - Visit Number 14    Authorization - Number of Visits 25    PT Start Time 4098    PT Stop Time 1315    PT Time Calculation (min) 45 min    Activity Tolerance Patient tolerated treatment well    Behavior During Therapy [facility] for tasks assessed/performed                     Past Medical History:  Diagnosis Date   Allergy    Depression with anxiety April 2015   History reviewed. No pertinent surgical history. 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   Irritation of right ulnar nerve 12/23/2018   Encounter for routine child health examination without abnormal findings 10/24/2016   BMI (body mass index), pediatric, 5% to less than 85% for age 19/09/2013   Depression with anxiety 06/24/2013   Inflammatory acne 06/24/2013   PCP: [facility]    REFERRING PROVIDER: Karlton Lemon, MD   REFERRING DIAG:  Ongoing Diagnosis: Strain of lumbar region, initial encounter New Diagnosis: S16.1XXA (ICD-10-CM) - Strain of neck muscle, initial encounter    Rationale for Evaluation and Treatment Rehabilitation   THERAPY DIAG:  Chronic bilateral low back pain without sciatica   ONSET DATE: at least 1 year ago lumbar, several months cervical/shoulders   SUBJECTIVE:                                                                                                                                                                                             SUBJECTIVE STATEMENT:  I traveled and had some Lt sided neck pain from the muscles, ranging from 2-6/10.  Overall I feel like it's improving and the HEP does help when I do it.   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 Yes : NPRS scale: 0/10 Pain location: lumbar Pain description: dull/achey/sharp Aggravating factors: quick movements, sitting in car, bending Relieving factors: sidelying position, standing up  CERVICAL: PAIN:  Are you having pain? Yes NPRS scale: 2-6/10 Pain location: bil shoulders to base of neck, intrascapular, to deltoid tubercle  Pain orientation: Bilateral  PAIN TYPE: aching, burning, sharp, throbbing, and tight Pain description: constant  Aggravating factors: drawing, lifting or carrying Relieving factors: massage, rest, tai chi      PRECAUTIONS: None   WEIGHT BEARING RESTRICTIONS No   FALLS:  Has patient fallen in last 6 months? No   LIVING ENVIRONMENT: Lives with: lives with their family Lives in: House/apartment      OCCUPATION: drawing at a desk, sometimes teaching at a college    PLOF: Independent   PATIENT GOALS improve pain with daily activity     OBJECTIVE:    DIAGNOSTIC FINDINGS:  None    PATIENT SURVEYS:   EVAL: FOTO: 47% 10/25/21: FOTO 47% 11/23/21: FOTO    SCREENING FOR RED FLAGS: Bowel or bladder incontinence: No Spinal tumors: No Cauda equina syndrome: No Compression fracture: No Abdominal aneurysm: No   COGNITION:           Overall cognitive status: Within functional limits for tasks assessed                          SENSATION: Not tested- denies numbness/tingling    MUSCLE LENGTH: Not tested    POSTURE: rounded shoulders, decreased lumbar lordosis, and posterior pelvic tilt (seated)   PALPATION: Palpable muscle spasm Lt lumbar paraspinals, no specific tenderness noted    LUMBAR ROM:    Active  A/PROM   eval A/ROM 9/6  Flexion WNL Full, mild pain  Extension WNL- pain with end range  Full, end range pain  Right lateral flexion   WNL with pain  Left lateral flexion   WNL with pain  Right rotation WNL, pain free Full no pain  Left rotation WNL, pain free  Full no pain   (Blank rows = not tested)   LOWER EXTREMITY STRENGTH WNL  CERVICAL ROM:  Active  A/PROM  9/6  Flexion 35  Extension  68  Right lateral flexion  45  Left lateral flexion  40  Right rotation 65  Left rotation 75   UPPER EXTREMITY STRENGTH:     Serratus anterior, middle and lower traps 3+/5 scapular winging in UE weight bearing    TODAY'S TREATMENT  12/27/21: UBE L3 2x2 PT present to discuss symptoms while traveling Seated upper trap stretch seated on hand 2x20", bil Supine green band horiz abd 1x15 Supine green band diagonals 1x10 each way Supine hooklying serratus press ups 5lb dumbbells in bil UE x 15 Supine neck sit ups x 5, neck sit ups diagonally x 3 each way Bent over single arm 1lb shoulder ext, T and Y (no weight) x 10 each Trigger Point Dry-Needling  Treatment instructions: Expect mild to moderate muscle soreness. S/S of pneumothorax if dry needled over a lung field, and to seek immediate medical attention should they occur. Patient verbalized understanding of these instructions and education.  Patient Consent Given: Yes Education handout provided: Yes Muscles treated: bil upper trap, anterior approach per Pt preference Electrical stimulation performed: No Parameters: N/A Treatment response/outcome: twitch and release Lt upper trap only Indirect technique: strain counterstrain Lt upper trap and SO release in supine   12/20/21: UBE L3 3x3 Seated upper trap stretch seated on hand 3x10", bil Seated neck retraction with towel resistance 10x5" holds In 1/4 squat: bil shoulder extension and fly x10  Seated lower trap  spread and scoop to 90/90 x 10 light green loop band Supine hooklying  serratus press ups 5lb dumbbells in bil UE x 10 Supine horiz abd bil shoulders green band x 10 with neck retraction Supine neck sit ups x 6 Trial of IFC to neck x 10' end of session  12/15/21: NuStep L5 x 5' PT  Seated LT upper trap stretch with overpressure 2x30" Seated neck retraction with towel resistance 10x5" holds Supine neck sit ups x 5 reps, VC for technique Standing green tband shoulder horiz abd 1x10, bil ER 1x10, stagger stance row stand on band x 10 Standing on yellow band in stagger stance bil shoulder ext yellow band x 10 Lower trap Y lift offs from wall x 10 UBE L4 2x2 fwd/bwd PT present to monitor Seated Lt upper trap TP release and active release   PATIENT EDUCATION:  Education details: improvements-progress towards goals/POC; HEP 4 days a week; posture awareness throughout the day Person educated: Patient Education method: Explanation, Corporate treasurer cues, Verbal cues, and Handouts Education comprehension: verbalized understanding and returned demonstration     HOME EXERCISE PROGRAM:  Access Code: [email or web address] URL: [email or web address] Date: 12/15/2021 Prepared by: Venetia Night Roselani Grajeda  Exercises - Supine Dead Bug with Leg Extension  - 2 x daily - 7 x weekly - 3 sets - 5-10 reps - Shoulder extension with resistance - Neutral  - 1 x daily - 7 x weekly - 2 sets - 10 reps - Cat Cow  - 1 x daily - 7 x weekly - 1 sets - 10 reps - 5 hold - Full Plank with Shoulder Taps  - 2 x daily - 4 x weekly - 1 sets - 10 reps - Half Kneeling Diagonal Chops with Resistance  - 1 x daily - 5 x weekly - 2 sets - 10 reps - Standing Diagonal Lift with Anchored Resistance  - 1 x daily - 7 x weekly - 3 sets - 10 reps - Half Kneeling Anti-Rotation Press - Forward Leg Opposite Anchor Side  - 1 x daily - 7 x weekly - 1 sets - 10 reps - Cervical Retraction with Resistance  - 1 x daily - 7 x weekly - 1 sets - 10 reps - 5 hold - Standing Shoulder Horizontal Abduction with Resistance  - 1 x  daily - 7 x weekly - 2 sets - 10 reps - Standing Bent Over Shoulder Row with Resistance  - 1 x daily - 7 x weekly - 2 sets - 10 reps - Low Trap Setting at [place]  - 1 x daily - 7 x weekly - 2 sets - 10 reps     ASSESSMENT:   CLINICAL IMPRESSION: Pt reports his HEP can and does help his pain.  Pt reports ongoing Lt neck cramping and pain since it spasmed while on airplane for trip last week.  DN with signif twitch and elongation to Lt upper trap today, followed by indirect technique of strain counterstrain with Pt education as to how to position for self-release using this technique.   Pt continues to need TC and VC for proper recruitment with shoulder I, T, Y.  He needs ongoing cueing for serratus activation through WB UE while doing contralateral I, T, Y.  Continue along POC with initial discussions over plan for d/c.     OBJECTIVE IMPAIRMENTS decreased endurance, decreased strength, increased muscle spasms, improper body mechanics, postural dysfunction, and pain.    ACTIVITY LIMITATIONS bending and reach over head, sitting, drawing   PARTICIPATION  LIMITATIONS: occupation   PERSONAL FACTORS Age, Past/current experiences, and Time since onset of injury/illness/exacerbation are also affecting patient's functional outcome.    REHAB POTENTIAL: Excellent   CLINICAL DECISION MAKING: Stable/uncomplicated   EVALUATION COMPLEXITY: Low     GOALS: Goals reviewed with patient? Yes   SHORT TERM GOALS: Target date: 11/02/2021   Pt will be independent with his initial HEP to improve trunk strength. Baseline: Goal status: met       LONG TERM GOALS: Target date: 11/16/2021   Pt will have pain free active and passive lumbar extension ROM. Baseline: 0-1/10 flexion (full), 2-3/10 extension (full) Goal status: ongoing   2.  Pt will have improve trunk strength evident by his ability to maintain plank on elbows/feet for up to 20 seconds, atleast 2/3 trials, without loss of trunk stability or  pain. Baseline:  Goal status: MET   3.  Pt will report atleast 50% improvement in his low back discomfort with various activities throughout the week.  Baseline: 10-20% Goal status: ongoing  4. Pt will be able to demonstrate proper posture/alignment in standing and sitting without the need for PT cuing in order to decrease excess strain on his low back throughout the day.  Baseline: requires cueing-verbal  Goal status: NEW 5. Pt will demo cervical flexion at least 55 deg to demo reduced spasm.  Goal status: NEW  Baseline: 35 deg 6. Pt will report compliance with improved ergonomic set up, postural awareness, and postural therex routine every 20-30 min while drawing to manage pain with activity. 7. Pt will achieve strength of at least 4/5 in scapular stabilizers for improved postural support with drawing.     PLAN: PT FREQUENCY: 1x/week   PT DURATION: 8 weeks   PLANNED INTERVENTIONS: Therapeutic exercises, Therapeutic activity, Neuromuscular re-education, Patient/Family education, Joint manipulation, Joint mobilization, Dry Needling, Spinal manipulation, Spinal mobilization, Moist heat, Taping, and Manual therapy.   PLAN FOR NEXT SESSION:  DN as needed, continue stabilization and strength of cervical and scapular stabilizers, instruct strain counterstrain as needed for positioning release  Khadeejah Castner, PT 12/27/21 1:22 PM

## 2021-12-29 ENCOUNTER — Ambulatory Visit: Payer: 59 | Admitting: Physical Therapy

## 2021-12-29 ENCOUNTER — Encounter: Payer: Self-pay | Admitting: Physical Therapy

## 2021-12-29 DIAGNOSIS — M545 Low back pain, unspecified: Secondary | ICD-10-CM

## 2021-12-29 DIAGNOSIS — M542 Cervicalgia: Secondary | ICD-10-CM | POA: Diagnosis not present

## 2021-12-29 DIAGNOSIS — G8929 Other chronic pain: Secondary | ICD-10-CM | POA: Diagnosis not present

## 2021-12-29 NOTE — Therapy (Signed)
OUTPATIENT PHYSICAL THERAPY TREATMENT NOTE RE-EVALUATION LUMBAR, EVALUATE CERVICAL   Patient Name: Kevin Lane MRN: [id number] DOB:[birth year], 21 y.o., male  Today's Date: 12/29/2021   PT End of Session - 12/29/21 1229     Visit Number 15    Date for PT Re-Evaluation 01/18/22    Authorization Type [facility], medical review after 25 visits    Authorization Time Period through 01/18/22    Authorization - Visit Number 15    Authorization - Number of Visits 25    PT Start Time 4917    PT Stop Time 1316    PT Time Calculation (min) 46 min    Activity Tolerance Patient tolerated treatment well    Behavior During Therapy [facility] for tasks assessed/performed                     Past Medical History:  Diagnosis Date   Allergy    Depression with anxiety April 2015   History reviewed. No pertinent surgical history. 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   Irritation of right ulnar nerve 12/23/2018   Encounter for routine child health examination without abnormal findings 10/24/2016   BMI (body mass index), pediatric, 5% to less than 85% for age 84/09/2013   Depression with anxiety 06/24/2013   Inflammatory acne 06/24/2013   PCP: [facility]    REFERRING PROVIDER: Karlton Lemon, MD   REFERRING DIAG:  Ongoing Diagnosis: Strain of lumbar region, initial encounter New Diagnosis: S16.1XXA (ICD-10-CM) - Strain of neck muscle, initial encounter    Rationale for Evaluation and Treatment Rehabilitation   THERAPY DIAG:  Chronic bilateral low back pain without sciatica   ONSET DATE: at least 1 year ago lumbar, several months cervical/shoulders   SUBJECTIVE:                                                                                                                                                                                             SUBJECTIVE STATEMENT:  Now that my neck is starting to feel better I think new pain is making itself more known in both of my shoulders right at the tips of my shoulders.   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 Yes : NPRS scale: 0/10 Pain location: lumbar Pain description: dull/achey/sharp Aggravating factors: quick movements, sitting in car, bending Relieving factors: sidelying position, standing up   CERVICAL:  PAIN:  Are you having pain? Yes NPRS scale: 2-6/10 Pain location: bil shoulders to base of neck, intrascapular, to deltoid tubercle  Pain orientation: Bilateral  PAIN TYPE: aching, burning, sharp, throbbing, and tight Pain description: constant  Aggravating factors: drawing, lifting or carrying Relieving factors: massage, rest, tai chi      PRECAUTIONS: None   WEIGHT BEARING RESTRICTIONS No   FALLS:  Has patient fallen in last 6 months? No   LIVING ENVIRONMENT: Lives with: lives with their family Lives in: House/apartment      OCCUPATION: drawing at a desk, sometimes teaching at a college    PLOF: Independent   PATIENT GOALS improve pain with daily activity     OBJECTIVE:    DIAGNOSTIC FINDINGS:  None    PATIENT SURVEYS:   EVAL: FOTO: 47% 10/25/21: FOTO 47% 11/23/21: FOTO    SCREENING FOR RED FLAGS: Bowel or bladder incontinence: No Spinal tumors: No Cauda equina syndrome: No Compression fracture: No Abdominal aneurysm: No   COGNITION:           Overall cognitive status: Within functional limits for tasks assessed                          SENSATION: Not tested- denies numbness/tingling    MUSCLE LENGTH: Not tested    POSTURE: rounded shoulders, decreased lumbar lordosis, and posterior pelvic tilt (seated)   PALPATION: Palpable muscle spasm Lt lumbar paraspinals, no specific tenderness noted    LUMBAR ROM:    Active  A/PROM   eval A/ROM 9/6  Flexion WNL Full, mild pain  Extension WNL- pain with end range  Full, end range pain  Right lateral flexion   WNL with pain  Left lateral flexion   WNL with pain  Right rotation WNL, pain free Full no pain  Left rotation WNL, pain free  Full no pain   (Blank rows = not tested)   LOWER EXTREMITY STRENGTH WNL  CERVICAL ROM:  Active  A/PROM  9/6  Flexion 35  Extension  68  Right lateral flexion  45  Left lateral flexion  40  Right rotation 65  Left rotation 75   UPPER EXTREMITY STRENGTH:     Serratus anterior, middle and lower traps 3+/5 scapular winging in UE weight bearing    TODAY'S TREATMENT  12/29/21: UBE L3 3x3 PT present to discuss symptoms and plan for session Use of shoulder model to discuss role of RC and scapular stabilizers, how impingement can occur Body mechanics for upper body framework with lifting 10lb box with squat from floor and 10lb dumbbell with UE reach  HEP revision/review: see HEP below  12/27/21: UBE L3 2x2 PT present to discuss symptoms while traveling Seated upper trap stretch seated on hand 2x20", bil Supine green band horiz abd 1x15 Supine green band diagonals 1x10 each way Supine hooklying serratus press ups 5lb dumbbells in bil UE x 15 Supine neck sit ups x 5, neck sit ups diagonally x 3 each way Bent over single arm 1lb shoulder ext, T and Y (no weight) x 10 each Trigger Point Dry-Needling  Treatment instructions: Expect mild to moderate muscle soreness. S/S of pneumothorax if dry needled over a lung field, and to seek immediate medical attention should they occur. Patient verbalized understanding of these instructions and education.  Patient Consent Given: Yes Education handout provided: Yes Muscles treated: bil upper trap, anterior approach per Pt preference Electrical stimulation performed: No Parameters: N/A Treatment response/outcome:  twitch and release Lt upper trap only Indirect technique: strain  counterstrain Lt upper trap and SO release in supine   12/20/21: UBE L3 3x3 Seated upper trap stretch seated on hand 3x10", bil Seated neck retraction with towel resistance 10x5" holds In 1/4 squat: bil shoulder extension and fly x10  Seated lower trap spread and scoop to 90/90 x 10 light green loop band Supine hooklying serratus press ups 5lb dumbbells in bil UE x 10 Supine horiz abd bil shoulders green band x 10 with neck retraction Supine neck sit ups x 6 Trial of IFC to neck x 10' end of session   PATIENT EDUCATION:  Education details: improvements-progress towards goals/POC; HEP 4 days a week; posture awareness throughout the day Person educated: Patient Education method: Explanation, Tactile cues, Verbal cues, and Handouts Education comprehension: verbalized understanding and returned demonstration     HOME EXERCISE PROGRAM:  Access Code: [email or web address] URL: [email or web address] Date: 12/29/2021 Prepared by: Venetia Night Jeana Kersting  Exercises - Supine Dead Bug with Leg Extension  - 2 x daily - 7 x weekly - 3 sets - 5-10 reps - Shoulder extension with resistance - Neutral  - 1 x daily - 7 x weekly - 2 sets - 10 reps - Cat Cow  - 1 x daily - 7 x weekly - 1 sets - 10 reps - 5 hold - Full Plank with Shoulder Taps  - 2 x daily - 4 x weekly - 1 sets - 10 reps - Half Kneeling Diagonal Chops with Resistance  - 1 x daily - 5 x weekly - 2 sets - 10 reps - Standing Diagonal Lift with Anchored Resistance  - 1 x daily - 7 x weekly - 3 sets - 10 reps - Half Kneeling Anti-Rotation Press - Forward Leg Opposite Anchor Side  - 1 x daily - 7 x weekly - 1 sets - 10 reps - Cervical Retraction with Resistance  - 1 x daily - 7 x weekly - 1 sets - 10 reps - 5 hold - Standing Shoulder Horizontal Abduction with Resistance  - 1 x daily - 7 x weekly - 2 sets - 10 reps - Standing Bent Over Shoulder Row with Resistance  - 1 x daily - 7 x weekly - 2 sets - 10 reps - Low Trap Setting at [place]  - 1 x  daily - 7 x weekly - 2 sets - 10 reps - Supine Serratus Punches Resistance  - 1 x daily - 7 x weekly - 2 sets - 10 reps - Quadruped Scapular Protraction and Retraction  - 1 x daily - 7 x weekly - 2 sets - 10 reps - Shoulder External Rotation with Anchored Resistance  - 1 x daily - 7 x weekly - 2 sets - 10 reps - Shoulder Internal Rotation with Resistance  - 1 x daily - 7 x weekly - 2 sets - 10 reps   ASSESSMENT:   CLINICAL IMPRESSION: Pt arrives concerned about some newer shoulder pains since starting PT on his neck and doing more in PT to strengthen his shoulders.  Assessment reveals some tenderness along RC tendons Lt>Rt with impingement signs with shoulder cross over.  PT used shoulder model to educate Pt about how weakness of shoulder girdle stabilizers can cause pain in RC tendons.  PT had advanced strengthening to prone I/Y/T with dumbbell last visit which is likely cause of soreness today.  Reviewed body mechanics for lifting today and revisited HEP to ensure proper level of challenge moving  forward.  Still on track towards d/c end of the month.     OBJECTIVE IMPAIRMENTS decreased endurance, decreased strength, increased muscle spasms, improper body mechanics, postural dysfunction, and pain.    ACTIVITY LIMITATIONS bending and reach over head, sitting, drawing   PARTICIPATION LIMITATIONS: occupation   PERSONAL FACTORS Age, Past/current experiences, and Time since onset of injury/illness/exacerbation are also affecting patient's functional outcome.    REHAB POTENTIAL: Excellent   CLINICAL DECISION MAKING: Stable/uncomplicated   EVALUATION COMPLEXITY: Low     GOALS: Goals reviewed with patient? Yes   SHORT TERM GOALS: Target date: 11/02/2021   Pt will be independent with his initial HEP to improve trunk strength. Baseline: Goal status: met       LONG TERM GOALS: Target date: 11/16/2021   Pt will have pain free active and passive lumbar extension ROM. Baseline: 0-1/10  flexion (full), 2-3/10 extension (full) Goal status: ongoing   2.  Pt will have improve trunk strength evident by his ability to maintain plank on elbows/feet for up to 20 seconds, atleast 2/3 trials, without loss of trunk stability or pain. Baseline:  Goal status: MET   3.  Pt will report atleast 50% improvement in his low back discomfort with various activities throughout the week.  Baseline: 10-20% Goal status: ongoing  4. Pt will be able to demonstrate proper posture/alignment in standing and sitting without the need for PT cuing in order to decrease excess strain on his low back throughout the day.  Baseline: requires cueing-verbal  Goal status: NEW 5. Pt will demo cervical flexion at least 55 deg to demo reduced spasm.  Goal status: NEW  Baseline: 35 deg 6. Pt will report compliance with improved ergonomic set up, postural awareness, and postural therex routine every 20-30 min while drawing to manage pain with activity. 7. Pt will achieve strength of at least 4/5 in scapular stabilizers for improved postural support with drawing.     PLAN: PT FREQUENCY: 1x/week   PT DURATION: 8 weeks   PLANNED INTERVENTIONS: Therapeutic exercises, Therapeutic activity, Neuromuscular re-education, Patient/Family education, Joint manipulation, Joint mobilization, Dry Needling, Spinal manipulation, Spinal mobilization, Moist heat, Taping, and Manual therapy.   PLAN FOR NEXT SESSION:  DN as needed, continue stabilization and strength of cervical and scapular stabilizers, instruct strain counterstrain as needed for positioning release  Olivya Sobol, PT 12/29/21 1:37 PM

## 2022-01-04 ENCOUNTER — Encounter: Payer: Self-pay | Admitting: Physical Therapy

## 2022-01-04 ENCOUNTER — Ambulatory Visit: Payer: 59 | Admitting: Physical Therapy

## 2022-01-04 DIAGNOSIS — G8929 Other chronic pain: Secondary | ICD-10-CM

## 2022-01-04 DIAGNOSIS — M542 Cervicalgia: Secondary | ICD-10-CM

## 2022-01-04 DIAGNOSIS — M545 Low back pain, unspecified: Secondary | ICD-10-CM | POA: Diagnosis not present

## 2022-01-04 NOTE — Therapy (Signed)
OUTPATIENT PHYSICAL THERAPY TREATMENT NOTE RE-EVALUATION LUMBAR, EVALUATE CERVICAL   Patient Name: Kevin Lane MRN: [id number] DOB:Jun 21, 2000, 21 y.o., male  Today's Date: 01/04/2022   PT End of Session - 01/04/22 1016     Visit Number 16    Date for PT Re-Evaluation 01/18/22    Authorization Type [facility], medical review after 25 visits    Authorization Time Period through 01/18/22    Authorization - Visit Number 16    Authorization - Number of Visits 25    PT Start Time 1016    PT Stop Time 1057    PT Time Calculation (min) 41 min    Activity Tolerance Patient tolerated treatment well    Behavior During Therapy [facility] for tasks assessed/performed                      Past Medical History:  Diagnosis Date   Allergy    Depression with anxiety April 2015   History reviewed. No pertinent surgical history. 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   Irritation of right ulnar nerve 12/23/2018   Encounter for routine child health examination without abnormal findings 10/24/2016   BMI (body mass index), pediatric, 5% to less than 85% for age 33/09/2013   Depression with anxiety 06/24/2013   Inflammatory acne 06/24/2013   PCP: [facility]    REFERRING PROVIDER: Karlton Lemon, MD   REFERRING DIAG:  Ongoing Diagnosis: Strain of lumbar region, initial encounter New Diagnosis: S16.1XXA (ICD-10-CM) - Strain of neck muscle, initial encounter    Rationale for Evaluation and Treatment Rehabilitation   THERAPY DIAG:  Chronic bilateral low back pain without sciatica   ONSET DATE: at least 1 year ago lumbar, several months cervical/shoulders   SUBJECTIVE:                                                                                                                                                                                             SUBJECTIVE STATEMENT:  My shoulders calmed down after last visit.     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 Yes : NPRS scale: 0/10 Pain location: lumbar Pain description: dull/achey/sharp Aggravating factors: quick movements, sitting in car, bending Relieving factors: sidelying position, standing up   CERVICAL: PAIN:  Are you having pain? Yes NPRS scale: 2/10 Pain location: bil shoulders to base of neck, intrascapular, to  deltoid tubercle  Pain orientation: Bilateral  PAIN TYPE: aching, burning, sharp, throbbing, and tight Pain description: constant  Aggravating factors: drawing, lifting or carrying Relieving factors: massage, rest, tai chi      PRECAUTIONS: None   WEIGHT BEARING RESTRICTIONS No   FALLS:  Has patient fallen in last 6 months? No   LIVING ENVIRONMENT: Lives with: lives with their family Lives in: House/apartment      OCCUPATION: drawing at a desk, sometimes teaching at a college    PLOF: Independent   PATIENT GOALS improve pain with daily activity     OBJECTIVE:    DIAGNOSTIC FINDINGS:  None    PATIENT SURVEYS:   EVAL: FOTO: 47% 10/25/21: FOTO 47% 11/23/21: FOTO    SCREENING FOR RED FLAGS: Bowel or bladder incontinence: No Spinal tumors: No Cauda equina syndrome: No Compression fracture: No Abdominal aneurysm: No   COGNITION:           Overall cognitive status: Within functional limits for tasks assessed                          SENSATION: Not tested- denies numbness/tingling    MUSCLE LENGTH: Not tested    POSTURE: rounded shoulders, decreased lumbar lordosis, and posterior pelvic tilt (seated)   PALPATION: Palpable muscle spasm Lt lumbar paraspinals, no specific tenderness noted    LUMBAR ROM:    Active  A/PROM  eval A/ROM 9/6  Flexion WNL Full, mild pain  Extension WNL- pain with end range  Full, end range pain   Right lateral flexion   WNL with pain  Left lateral flexion   WNL with pain  Right rotation WNL, pain free Full no pain  Left rotation WNL, pain free  Full no pain   (Blank rows = not tested)   LOWER EXTREMITY STRENGTH WNL  CERVICAL ROM:  Active  A/PROM  9/6  Flexion 35  Extension  68  Right lateral flexion  45  Left lateral flexion  40  Right rotation 65  Left rotation 75   UPPER EXTREMITY STRENGTH:     Serratus anterior, middle and lower traps 3+/5 scapular winging in UE weight bearing    TODAY'S TREATMENT  01/04/22: UBE L3 3x3 PT present to discuss status and symptoms Supine green band horiz abd 2x15 Supine green band diagonals 1x10 each way Supine T with 2lb dumbbell x 5 each UE SL ER 1lb 1x10 bil, TC for "suction cup shoulder" position Supine 1lb shoulder circles 20x each way bil Supine single arm serratus punch 5lb 10x bil Seated upper trap stretch seated on hand 2x20" bil Box pick up rows with box on low mat table in 1/4 squat 2x10 Resisted walking bwd with red resistance band at sides 5x5" hold, emphasis on postural hold Standing bil green band row 2x15 Wall push ups x 10  12/29/21: UBE L3 3x3 PT present to discuss symptoms and plan for session Use of shoulder model to discuss role of RC and scapular stabilizers, how impingement can occur Body mechanics for upper body framework with lifting 10lb box with squat from floor and 10lb dumbbell with UE reach  HEP revision/review: see HEP below  12/27/21: UBE L3 2x2 PT present to discuss symptoms while traveling Seated upper trap stretch seated on hand 2x20", bil Supine green band horiz abd 1x15 Supine green band diagonals 1x10 each way Supine hooklying serratus press ups 5lb dumbbells in bil UE x 15 Supine neck sit ups x  5, neck sit ups diagonally x 3 each way Bent over single arm 1lb shoulder ext, T and Y (no weight) x 10 each Trigger Point Dry-Needling  Treatment instructions: Expect mild to moderate  muscle soreness. S/S of pneumothorax if dry needled over a lung field, and to seek immediate medical attention should they occur. Patient verbalized understanding of these instructions and education.  Patient Consent Given: Yes Education handout provided: Yes Muscles treated: bil upper trap, anterior approach per Pt preference Electrical stimulation performed: No Parameters: N/A Treatment response/outcome: twitch and release Lt upper trap only Indirect technique: strain counterstrain Lt upper trap and SO release in supine     PATIENT EDUCATION:  Education details: improvements-progress towards goals/POC; HEP 4 days a week; posture awareness throughout the day Person educated: Patient Education method: Explanation, Corporate treasurer cues, Verbal cues, and Handouts Education comprehension: verbalized understanding and returned demonstration     HOME EXERCISE PROGRAM:  Access Code: [email or web address] URL: [email or web address] Date: 12/29/2021 Prepared by: Venetia Night Liani Caris  Exercises - Supine Dead Bug with Leg Extension  - 2 x daily - 7 x weekly - 3 sets - 5-10 reps - Shoulder extension with resistance - Neutral  - 1 x daily - 7 x weekly - 2 sets - 10 reps - Cat Cow  - 1 x daily - 7 x weekly - 1 sets - 10 reps - 5 hold - Full Plank with Shoulder Taps  - 2 x daily - 4 x weekly - 1 sets - 10 reps - Half Kneeling Diagonal Chops with Resistance  - 1 x daily - 5 x weekly - 2 sets - 10 reps - Standing Diagonal Lift with Anchored Resistance  - 1 x daily - 7 x weekly - 3 sets - 10 reps - Half Kneeling Anti-Rotation Press - Forward Leg Opposite Anchor Side  - 1 x daily - 7 x weekly - 1 sets - 10 reps - Cervical Retraction with Resistance  - 1 x daily - 7 x weekly - 1 sets - 10 reps - 5 hold - Standing Shoulder Horizontal Abduction with Resistance  - 1 x daily - 7 x weekly - 2 sets - 10 reps - Standing Bent Over Shoulder Row with Resistance  - 1 x daily - 7 x weekly - 2 sets - 10 reps - Low Trap  Setting at [place]  - 1 x daily - 7 x weekly - 2 sets - 10 reps - Supine Serratus Punches Resistance  - 1 x daily - 7 x weekly - 2 sets - 10 reps - Quadruped Scapular Protraction and Retraction  - 1 x daily - 7 x weekly - 2 sets - 10 reps - Shoulder External Rotation with Anchored Resistance  - 1 x daily - 7 x weekly - 2 sets - 10 reps - Shoulder Internal Rotation with Resistance  - 1 x daily - 7 x weekly - 2 sets - 10 reps   ASSESSMENT:   CLINICAL IMPRESSION: Pt with improved shoulder pain since last session.  He was able to demo improved proximal control of bil shoulders today with more varied load and positional strength.  Application of global body strength in 1/4 squat with 10lb box pick ups was well tolerated after VC and demo of proper trunk position using core vs lumbar paraspinals into too much extension. Pt tapering to 1x/week for 2 more weeks with anticipated d/c at that time.     OBJECTIVE IMPAIRMENTS decreased endurance, decreased strength, increased muscle spasms, improper body  mechanics, postural dysfunction, and pain.    ACTIVITY LIMITATIONS bending and reach over head, sitting, drawing   PARTICIPATION LIMITATIONS: occupation   PERSONAL FACTORS Age, Past/current experiences, and Time since onset of injury/illness/exacerbation are also affecting patient's functional outcome.    REHAB POTENTIAL: Excellent   CLINICAL DECISION MAKING: Stable/uncomplicated   EVALUATION COMPLEXITY: Low     GOALS: Goals reviewed with patient? Yes   SHORT TERM GOALS: Target date: 11/02/2021   Pt will be independent with his initial HEP to improve trunk strength. Baseline: Goal status: met       LONG TERM GOALS: Target date: 11/16/2021   Pt will have pain free active and passive lumbar extension ROM. Baseline: 0-1/10 flexion (full), 2-3/10 extension (full) Goal status: ongoing   2.  Pt will have improve trunk strength evident by his ability to maintain plank on elbows/feet for up to 20  seconds, atleast 2/3 trials, without loss of trunk stability or pain. Baseline:  Goal status: MET   3.  Pt will report atleast 50% improvement in his low back discomfort with various activities throughout the week.  Baseline: 10-20% Goal status: ongoing  4. Pt will be able to demonstrate proper posture/alignment in standing and sitting without the need for PT cuing in order to decrease excess strain on his low back throughout the day.  Baseline: requires cueing-verbal  Goal status: NEW 5. Pt will demo cervical flexion at least 55 deg to demo reduced spasm.  Goal status: NEW  Baseline: 35 deg 6. Pt will report compliance with improved ergonomic set up, postural awareness, and postural therex routine every 20-30 min while drawing to manage pain with activity. 7. Pt will achieve strength of at least 4/5 in scapular stabilizers for improved postural support with drawing.     PLAN: PT FREQUENCY: 1x/week   PT DURATION: 8 weeks   PLANNED INTERVENTIONS: Therapeutic exercises, Therapeutic activity, Neuromuscular re-education, Patient/Family education, Joint manipulation, Joint mobilization, Dry Needling, Spinal manipulation, Spinal mobilization, Moist heat, Taping, and Manual therapy.   PLAN FOR NEXT SESSION:  DN as needed, continue stabilization and strength of cervical and scapular stabilizers, instruct strain counterstrain as needed for positioning release  Johanna Beuhring, PT 01/04/22 10:58 AM

## 2022-01-11 DIAGNOSIS — J320 Chronic maxillary sinusitis: Secondary | ICD-10-CM | POA: Diagnosis not present

## 2022-01-11 DIAGNOSIS — R519 Headache, unspecified: Secondary | ICD-10-CM | POA: Diagnosis not present

## 2022-01-11 DIAGNOSIS — Z1152 Encounter for screening for COVID-19: Secondary | ICD-10-CM | POA: Diagnosis not present

## 2022-01-11 DIAGNOSIS — J3489 Other specified disorders of nose and nasal sinuses: Secondary | ICD-10-CM | POA: Diagnosis not present

## 2022-01-11 DIAGNOSIS — K0889 Other specified disorders of teeth and supporting structures: Secondary | ICD-10-CM | POA: Diagnosis not present

## 2022-01-11 DIAGNOSIS — R5383 Other fatigue: Secondary | ICD-10-CM | POA: Diagnosis not present

## 2022-01-12 ENCOUNTER — Encounter: Payer: Self-pay | Admitting: Physical Therapy

## 2022-01-12 ENCOUNTER — Ambulatory Visit: Payer: 59 | Admitting: Physical Therapy

## 2022-01-12 DIAGNOSIS — M542 Cervicalgia: Secondary | ICD-10-CM

## 2022-01-12 DIAGNOSIS — M545 Low back pain, unspecified: Secondary | ICD-10-CM

## 2022-01-12 DIAGNOSIS — G8929 Other chronic pain: Secondary | ICD-10-CM | POA: Diagnosis not present

## 2022-01-12 NOTE — Therapy (Signed)
OUTPATIENT PHYSICAL THERAPY TREATMENT NOTE RE-EVALUATION LUMBAR, EVALUATE CERVICAL   Patient Name: Kevin Lane MRN: [id number] DOB:09/06/2000, 21 y.o., male  Today's Date: 01/12/2022   PT End of Session - 01/12/22 1235     Visit Number 17    Date for PT Re-Evaluation 01/18/22    Authorization Type [facility], medical review after 25 visits    Authorization Time Period through 01/18/22    Authorization - Visit Number [id number]    Authorization - Number of Visits 25    PT Start Time 1233    PT Stop Time 1315    PT Time Calculation (min) 42 min    Activity Tolerance Patient tolerated treatment well    Behavior During Therapy [facility] for tasks assessed/performed                       Past Medical History:  Diagnosis Date   Allergy    Depression with anxiety April 2015   History reviewed. No pertinent surgical history. 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   Irritation of right ulnar nerve 12/23/2018   Encounter for routine child health examination without abnormal findings 10/24/2016   BMI (body mass index), pediatric, 5% to less than 85% for age 11/24/2013   Depression with anxiety 06/24/2013   Inflammatory acne 06/24/2013   PCP: [facility]    REFERRING PROVIDER: Karlton Lemon, MD   REFERRING DIAG:  Ongoing Diagnosis: Strain of lumbar region, initial encounter New Diagnosis: S16.1XXA (ICD-10-CM) - Strain of neck muscle, initial encounter    Rationale for Evaluation and Treatment Rehabilitation   THERAPY DIAG:  Chronic bilateral low back pain without sciatica   ONSET DATE: at least 1 year ago lumbar, several months cervical/shoulders   SUBJECTIVE:                                                                                                                                                                                             SUBJECTIVE STATEMENT:  I have not had the chronic pain in between the shoulder blades that I used to have for years since starting PT.  When I do get pain I can understand what I did to bring it on and then I can recover.     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 Yes : NPRS scale: 0/10 Pain location: lumbar Pain description: dull/achey/sharp  Aggravating factors: quick movements, sitting in car, bending Relieving factors: sidelying position, standing up   CERVICAL: PAIN:  Are you having pain? Yes NPRS scale: 2/10 Pain location: bil shoulders to base of neck, intrascapular, to deltoid tubercle  Pain orientation: Bilateral  PAIN TYPE: aching, burning, sharp, throbbing, and tight Pain description: constant  Aggravating factors: drawing, lifting or carrying Relieving factors: massage, rest, tai chi      PRECAUTIONS: None   WEIGHT BEARING RESTRICTIONS No   FALLS:  Has patient fallen in last 6 months? No   LIVING ENVIRONMENT: Lives with: lives with their family Lives in: House/apartment      OCCUPATION: drawing at a desk, sometimes teaching at a college    PLOF: Independent   PATIENT GOALS improve pain with daily activity     OBJECTIVE:    DIAGNOSTIC FINDINGS:  None    PATIENT SURVEYS:   EVAL: FOTO: 47% 10/25/21: FOTO 47% 01/12/22: FOTO 58%   SCREENING FOR RED FLAGS: Bowel or bladder incontinence: No Spinal tumors: No Cauda equina syndrome: No Compression fracture: No Abdominal aneurysm: No   COGNITION:           Overall cognitive status: Within functional limits for tasks assessed                          SENSATION: Not tested- denies numbness/tingling    MUSCLE LENGTH: Not tested    POSTURE: rounded shoulders, decreased lumbar lordosis, and posterior pelvic tilt (seated)   PALPATION: Palpable muscle spasm Lt lumbar paraspinals, no specific  tenderness noted    LUMBAR ROM:    Active  A/PROM  eval A/ROM 9/6  Flexion WNL Full, mild pain  Extension WNL- pain with end range  Full, end range pain  Right lateral flexion   WNL with pain  Left lateral flexion   WNL with pain  Right rotation WNL, pain free Full no pain  Left rotation WNL, pain free  Full no pain   (Blank rows = not tested)   LOWER EXTREMITY STRENGTH WNL  CERVICAL ROM:  Active  A/PROM  9/6 A/ROM  Flexion 35 55  Extension  68 75  Right lateral flexion  45 45  Left lateral flexion  40 45  Right rotation 65 70  Left rotation 75 75   UPPER EXTREMITY STRENGTH:     Serratus anterior, middle and lower traps 3+/5 scapular winging in UE weight bearing    TODAY'S TREATMENT  01/12/22: UBE L3 3x3 PT present to discuss status and symptoms Seated upper trap stretch seated on hand 2x20" Lt Standing bil blue band row 2x15 Green loop band spread and scoop lower trap setting x 10 Wall push ups x 10 Supine 1lb shoulder circles 20x each way bil Supine T with 2lb dumbbell x 5 each UE Serratus punch 5lb 2x10 single arm, each UE Supine green band horiz abd 2x15 Supine green band diagonals 1x10 each way Review of how to achieve neck support in bed in both supine and SL  01/04/22: UBE L3 3x3 PT present to discuss status and symptoms Supine green band horiz abd 2x15 Supine green band diagonals 1x10 each way Supine T with 2lb dumbbell x 5 each UE SL ER 1lb 1x10 bil, TC for "suction cup shoulder" position Supine 1lb shoulder circles 20x each way bil Supine single arm serratus punch 5lb 10x bil Seated upper trap stretch seated on hand 2x20" bil Box pick up rows with box on low  mat table in 1/4 squat 2x10 Resisted walking bwd with red resistance band at sides 5x5" hold, emphasis on postural hold Standing bil green band row 2x15 Wall push ups x 10  12/29/21: UBE L3 3x3 PT present to discuss symptoms and plan for session Use of shoulder model to discuss role of  RC and scapular stabilizers, how impingement can occur Body mechanics for upper body framework with lifting 10lb box with squat from floor and 10lb dumbbell with UE reach  HEP revision/review: see HEP below    PATIENT EDUCATION:  Education details: improvements-progress towards goals/POC; HEP 4 days a week; posture awareness throughout the day Person educated: Patient Education method: Explanation, Tactile cues, Verbal cues, and Handouts Education comprehension: verbalized understanding and returned demonstration     HOME EXERCISE PROGRAM:  Access Code: [email or web address] URL: [email or web address] Date: 12/29/2021 Prepared by: Venetia Night Zackrey Dyar  Exercises - Supine Dead Bug with Leg Extension  - 2 x daily - 7 x weekly - 3 sets - 5-10 reps - Shoulder extension with resistance - Neutral  - 1 x daily - 7 x weekly - 2 sets - 10 reps - Cat Cow  - 1 x daily - 7 x weekly - 1 sets - 10 reps - 5 hold - Full Plank with Shoulder Taps  - 2 x daily - 4 x weekly - 1 sets - 10 reps - Half Kneeling Diagonal Chops with Resistance  - 1 x daily - 5 x weekly - 2 sets - 10 reps - Standing Diagonal Lift with Anchored Resistance  - 1 x daily - 7 x weekly - 3 sets - 10 reps - Half Kneeling Anti-Rotation Press - Forward Leg Opposite Anchor Side  - 1 x daily - 7 x weekly - 1 sets - 10 reps - Cervical Retraction with Resistance  - 1 x daily - 7 x weekly - 1 sets - 10 reps - 5 hold - Standing Shoulder Horizontal Abduction with Resistance  - 1 x daily - 7 x weekly - 2 sets - 10 reps - Standing Bent Over Shoulder Row with Resistance  - 1 x daily - 7 x weekly - 2 sets - 10 reps - Low Trap Setting at [place]  - 1 x daily - 7 x weekly - 2 sets - 10 reps - Supine Serratus Punches Resistance  - 1 x daily - 7 x weekly - 2 sets - 10 reps - Quadruped Scapular Protraction and Retraction  - 1 x daily - 7 x weekly - 2 sets - 10 reps - Shoulder External Rotation with Anchored Resistance  - 1 x daily - 7 x weekly - 2 sets -  10 reps - Shoulder Internal Rotation with Resistance  - 1 x daily - 7 x weekly - 2 sets - 10 reps   ASSESSMENT:   CLINICAL IMPRESSION: Pt with much improved neck ROM since initial eval.  He reports he has finally interrupted his cycle of chronic pain in upper back and neck.  He gets Lt>Rt tightness or discomfort intermittently but can always attribute it to a position or activity and recovers quickly.  His lumbar FOTO score did increase today after being stalled, reaching 58% (initial 47%), not quite reaching LTG but with Pt report of feeling like he had his back pain under control with improved use of body mechanics and strength.  Pt will be ready to d/c to HEP next visit at [facility].     OBJECTIVE IMPAIRMENTS decreased endurance, decreased strength, increased muscle  spasms, improper body mechanics, postural dysfunction, and pain.    ACTIVITY LIMITATIONS bending and reach over head, sitting, drawing   PARTICIPATION LIMITATIONS: occupation   PERSONAL FACTORS Age, Past/current experiences, and Time since onset of injury/illness/exacerbation are also affecting patient's functional outcome.    REHAB POTENTIAL: Excellent   CLINICAL DECISION MAKING: Stable/uncomplicated   EVALUATION COMPLEXITY: Low     GOALS: Goals reviewed with patient? Yes   SHORT TERM GOALS: Target date: 11/02/2021   Pt will be independent with his initial HEP to improve trunk strength. Baseline: Goal status: met       LONG TERM GOALS: Target date: 11/16/2021   Pt will have pain free active and passive lumbar extension ROM. Baseline: 0-1/10 flexion (full), 2-3/10 extension (full) Goal status: met   2.  Pt will have improve trunk strength evident by his ability to maintain plank on elbows/feet for up to 20 seconds, atleast 2/3 trials, without loss of trunk stability or pain. Baseline:  Goal status: met   3.  Pt will report atleast 50% improvement in his low back discomfort with various activities throughout the  week.  Baseline:  Goal status: met  4. Pt will be able to demonstrate proper posture/alignment in standing and sitting without the need for PT cuing in order to decrease excess strain on his low back throughout the day.  Baseline: requires cueing-verbal  Goal status: met  5. Pt will demo cervical flexion at least 55 deg to demo reduced spasm.  Goal status: met  Baseline: 35 deg  6. Pt will report compliance with improved ergonomic set up, postural awareness, and postural therex routine every 20-30 min while drawing to manage pain with activity.  Goal status: met  7. Pt will achieve strength of at least 4/5 in scapular stabilizers for improved postural support with drawing.  Goal status: met     PLAN: PT FREQUENCY: 1x/week   PT DURATION: 8 weeks   PLANNED INTERVENTIONS: Therapeutic exercises, Therapeutic activity, Neuromuscular re-education, Patient/Family education, Joint manipulation, Joint mobilization, Dry Needling, Spinal manipulation, Spinal mobilization, Moist heat, Taping, and Manual therapy.   PLAN FOR NEXT SESSION:  [facility], finalize HEP and d/c   Johanna Beuhring, PT 01/12/22 1:29 PM

## 2022-01-17 ENCOUNTER — Ambulatory Visit: Payer: 59 | Admitting: Physical Therapy

## 2022-01-17 ENCOUNTER — Encounter: Payer: Self-pay | Admitting: Physical Therapy

## 2022-01-17 DIAGNOSIS — M542 Cervicalgia: Secondary | ICD-10-CM | POA: Diagnosis not present

## 2022-01-17 DIAGNOSIS — M545 Low back pain, unspecified: Secondary | ICD-10-CM | POA: Diagnosis not present

## 2022-01-17 DIAGNOSIS — G8929 Other chronic pain: Secondary | ICD-10-CM | POA: Diagnosis not present

## 2022-01-17 NOTE — Therapy (Signed)
OUTPATIENT PHYSICAL THERAPY TREATMENT NOTE RE-EVALUATION LUMBAR, EVALUATE CERVICAL   Patient Name: Kevin Lane MRN: [id number] DOB:02/23/2001, 21 y.o., male  Today's Date: 01/17/2022   PT End of Session - 01/17/22 1225     Visit Number 18    Date for PT Re-Evaluation 01/18/22    Authorization Type [facility], medical review after 25 visits    Authorization Time Period through 01/18/22    Authorization - Visit Number 18    Authorization - Number of Visits 25    PT Start Time 1226    PT Stop Time 1312    PT Time Calculation (min) 46 min    Activity Tolerance Patient tolerated treatment well    Behavior During Therapy [facility] for tasks assessed/performed                        Past Medical History:  Diagnosis Date   Allergy    Depression with anxiety April 2015   History reviewed. No pertinent surgical history. 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   Irritation of right ulnar nerve 12/23/2018   Encounter for routine child health examination without abnormal findings 10/24/2016   BMI (body mass index), pediatric, 5% to less than 85% for age 12/24/2013   Depression with anxiety 06/24/2013   Inflammatory acne 06/24/2013   PCP: [facility]    REFERRING PROVIDER: Karlton Lemon, MD   REFERRING DIAG:  Ongoing Diagnosis: Strain of lumbar region, initial encounter New Diagnosis: S16.1XXA (ICD-10-CM) - Strain of neck muscle, initial encounter    Rationale for Evaluation and Treatment Rehabilitation   THERAPY DIAG:  Chronic bilateral low back pain without sciatica   ONSET DATE: at least 1 year ago lumbar, several months cervical/shoulders   SUBJECTIVE:                                                                                                                                                                                             SUBJECTIVE STATEMENT:  My back flares on occasion but quickly recovers.  My neck has been pretty good.  From time to time it will act up but generally it is pretty tamed.  I don't have the mid/upper back pain chronically like I used to.     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 Yes : NPRS scale: 0/10 Pain location: lumbar Pain description:  dull/achey/sharp Aggravating factors: quick movements, sitting in car, bending Relieving factors: sidelying position, standing up   CERVICAL: PAIN:  Are you having pain? Yes NPRS scale: 1/10 Pain location: bil shoulders to base of neck, intrascapular, to deltoid tubercle  Pain orientation: Bilateral  PAIN TYPE: dull Pain description: constant  Aggravating factors: drawing, lifting or carrying Relieving factors: massage, rest, tai chi      PRECAUTIONS: None   WEIGHT BEARING RESTRICTIONS No   FALLS:  Has patient fallen in last 6 months? No   LIVING ENVIRONMENT: Lives with: lives with their family Lives in: House/apartment      OCCUPATION: drawing at a desk, sometimes teaching at a college    PLOF: Independent   PATIENT GOALS improve pain with daily activity     OBJECTIVE:    DIAGNOSTIC FINDINGS:  None    PATIENT SURVEYS:   EVAL: FOTO: 47% 10/25/21: FOTO 47% 01/12/22: FOTO 58%   SCREENING FOR RED FLAGS: Bowel or bladder incontinence: No Spinal tumors: No Cauda equina syndrome: No Compression fracture: No Abdominal aneurysm: No   COGNITION:           Overall cognitive status: Within functional limits for tasks assessed                          SENSATION: Not tested- denies numbness/tingling    MUSCLE LENGTH: Not tested    POSTURE: rounded shoulders, decreased lumbar lordosis, and posterior pelvic tilt (seated)   PALPATION: Palpable muscle spasm Lt lumbar paraspinals, no specific tenderness noted     LUMBAR ROM:    Active  A/PROM  eval A/ROM 9/6  Flexion WNL Full, mild pain  Extension WNL- pain with end range  Full, end range pain  Right lateral flexion   WNL with pain  Left lateral flexion   WNL with pain  Right rotation WNL, pain free Full no pain  Left rotation WNL, pain free  Full no pain   (Blank rows = not tested)   LOWER EXTREMITY STRENGTH WNL  CERVICAL ROM:  Active  A/PROM  9/6 A/ROM 10/26  Flexion 35 55  Extension  68 75  Right lateral flexion  45 45  Left lateral flexion  40 45  Right rotation 65 70  Left rotation 75 75   UPPER EXTREMITY STRENGTH:   01/17/22: bil upper quadrants grossly 4/5 to 4+/5, mild scapular winging but Pt able to control with cueing  Eval: Serratus anterior, middle and lower traps 3+/5, scapular winging in UE weight bearing    TODAY'S TREATMENT  01/17/22 UBE L3 3x3 PT present to discuss status and symptoms Seated upper trap stretch seated on hand 2x20" Lt Standing bil blue band row 2x15 red loop band spread and scoop lower trap setting x 10 Wall push ups x 10 Supine 1lb shoulder circles 20x each way bil Supine T with 2lb dumbbell 2x 5 each UE Serratus punch 5lb 2x10 single arm, each UE Supine green band horiz abd 2x15 Seated bil shoulder ER green tband 2x10  01/12/22: UBE L3 3x3 PT present to discuss status and symptoms Seated upper trap stretch seated on hand 2x20" Lt Standing bil blue band row 2x15 Green loop band spread and scoop lower trap setting x 10 Wall push ups x 10 Supine 1lb shoulder circles 20x each way bil Supine T with 2lb dumbbell x 5 each UE Serratus punch 5lb 2x10 single arm, each UE Supine green band horiz abd 2x15 Supine green band  diagonals 1x10 each way Review of how to achieve neck support in bed in both supine and SL  01/04/22: UBE L3 3x3 PT present to discuss status and symptoms Supine green band horiz abd 2x15 Supine green band diagonals 1x10 each way Supine T with 2lb dumbbell x 5 each  UE SL ER 1lb 1x10 bil, TC for "suction cup shoulder" position Supine 1lb shoulder circles 20x each way bil Supine single arm serratus punch 5lb 10x bil Seated upper trap stretch seated on hand 2x20" bil Box pick up rows with box on low mat table in 1/4 squat 2x10 Resisted walking bwd with red resistance band at sides 5x5" hold, emphasis on postural hold Standing bil green band row 2x15 Wall push ups x 10    PATIENT EDUCATION:  Education details: improvements-progress towards goals/POC; HEP 4 days a week; posture awareness throughout the day Person educated: Patient Education method: Explanation, Tactile cues, Verbal cues, and Handouts Education comprehension: verbalized understanding and returned demonstration     HOME EXERCISE PROGRAM:  Access Code: [email or web address] URL: [email or web address] Date: 12/29/2021 Prepared by: Venetia Night Quayshawn Nin  Exercises - Supine Dead Bug with Leg Extension  - 2 x daily - 7 x weekly - 3 sets - 5-10 reps - Shoulder extension with resistance - Neutral  - 1 x daily - 7 x weekly - 2 sets - 10 reps - Cat Cow  - 1 x daily - 7 x weekly - 1 sets - 10 reps - 5 hold - Full Plank with Shoulder Taps  - 2 x daily - 4 x weekly - 1 sets - 10 reps - Half Kneeling Diagonal Chops with Resistance  - 1 x daily - 5 x weekly - 2 sets - 10 reps - Standing Diagonal Lift with Anchored Resistance  - 1 x daily - 7 x weekly - 3 sets - 10 reps - Half Kneeling Anti-Rotation Press - Forward Leg Opposite Anchor Side  - 1 x daily - 7 x weekly - 1 sets - 10 reps - Cervical Retraction with Resistance  - 1 x daily - 7 x weekly - 1 sets - 10 reps - 5 hold - Standing Shoulder Horizontal Abduction with Resistance  - 1 x daily - 7 x weekly - 2 sets - 10 reps - Standing Bent Over Shoulder Row with Resistance  - 1 x daily - 7 x weekly - 2 sets - 10 reps - Low Trap Setting at [place]  - 1 x daily - 7 x weekly - 2 sets - 10 reps - Supine Serratus Punches Resistance  - 1 x daily - 7 x weekly  - 2 sets - 10 reps - Quadruped Scapular Protraction and Retraction  - 1 x daily - 7 x weekly - 2 sets - 10 reps - Shoulder External Rotation with Anchored Resistance  - 1 x daily - 7 x weekly - 2 sets - 10 reps - Shoulder Internal Rotation with Resistance  - 1 x daily - 7 x weekly - 2 sets - 10 reps   ASSESSMENT:   CLINICAL IMPRESSION: Pt with much improved neck ROM and pain experience since initial eval.  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  He reports he has finally interrupted his cycle of chronic pain in upper back and neck.  He needs to continue working on upper quadrant strength specifically of shoulders and scapulae but is ind with HEP at this time so this should continue  to improve with HEP compliance.  He gets Lt>Rt tightness or discomfort intermittently but can always attribute it to a position or activity and recovers quickly.  Pt reports he has his back pain under control with improved use of body mechanics and strength.  Pt is ready to d/c to HEP at this time.      OBJECTIVE IMPAIRMENTS decreased endurance, decreased strength, increased muscle spasms, improper body mechanics, postural dysfunction, and pain.    ACTIVITY LIMITATIONS bending and reach over head, sitting, drawing   PARTICIPATION LIMITATIONS: occupation   PERSONAL FACTORS Age, Past/current experiences, and Time since onset of injury/illness/exacerbation are also affecting patient's functional outcome.    REHAB POTENTIAL: Excellent   CLINICAL DECISION MAKING: Stable/uncomplicated   EVALUATION COMPLEXITY: Low     GOALS: Goals reviewed with patient? Yes   SHORT TERM GOALS: Target date: 11/02/2021   Pt will be independent with his initial HEP to improve trunk strength. Baseline: Goal status: met       LONG TERM GOALS: Target date: 11/16/2021   Pt will have pain free active and passive lumbar extension ROM. Baseline: 0-1/10 flexion  (full), 2-3/10 extension (full) Goal status: met   2.  Pt will have improve trunk strength evident by his ability to maintain plank on elbows/feet for up to 20 seconds, atleast 2/3 trials, without loss of trunk stability or pain. Baseline:  Goal status: met   3.  Pt will report atleast 50% improvement in his low back discomfort with various activities throughout the week.  Baseline:  Goal status: met  4. Pt will be able to demonstrate proper posture/alignment in standing and sitting without the need for PT cuing in order to decrease excess strain on his low back throughout the day.  Baseline: requires cueing-verbal  Goal status: met  5. Pt will demo cervical flexion at least 55 deg to demo reduced spasm.  Goal status: met  Baseline: 35 deg, 55 deg  6. Pt will report compliance with improved ergonomic set up, postural awareness, and postural therex routine every 20-30 min while drawing to manage pain with activity.  Goal status: met  7. Pt will achieve strength of at least 4/5 in scapular stabilizers for improved postural support with drawing.  Goal status: met     PLAN: PT FREQUENCY: 1x/week   PT DURATION: 8 weeks   PLANNED INTERVENTIONS: Therapeutic exercises, Therapeutic activity, Neuromuscular re-education, Patient/Family education, Joint manipulation, Joint mobilization, Dry Needling, Spinal manipulation, Spinal mobilization, Moist heat, Taping, and Manual therapy.   PLAN FOR NEXT SESSION:  d/c    PHYSICAL THERAPY DISCHARGE SUMMARY  Visits from Start of Care: 18  Current functional level related to goals / functional outcomes: See above.  PT started with lumbar spine focus and then neck/shoulders were added.  Pt is ready to d/c all focus areas at this time with good control of pain and HEP to continue targeting strength and flexibility.   Remaining deficits: See above   Education / Equipment: HEP   Patient agrees to discharge. Patient goals were met. Patient is  being discharged due to meeting the stated rehab goals.  Eron Goble, PT 01/17/22 1:13 PM

## 2022-04-02 ENCOUNTER — Other Ambulatory Visit: Payer: Self-pay | Admitting: Family Medicine

## 2022-04-03 ENCOUNTER — Other Ambulatory Visit (HOSPITAL_COMMUNITY): Payer: Self-pay

## 2022-04-04 ENCOUNTER — Other Ambulatory Visit (HOSPITAL_COMMUNITY): Payer: Self-pay

## 2022-04-12 ENCOUNTER — Other Ambulatory Visit (HOSPITAL_COMMUNITY): Payer: Self-pay

## 2022-04-13 ENCOUNTER — Other Ambulatory Visit (HOSPITAL_COMMUNITY): Payer: Self-pay

## 2022-12-13 DIAGNOSIS — Z1839 Other retained organic fragments: Secondary | ICD-10-CM | POA: Diagnosis not present

## 2022-12-27 DIAGNOSIS — Z0000 Encounter for general adult medical examination without abnormal findings: Secondary | ICD-10-CM | POA: Diagnosis not present

## 2022-12-27 DIAGNOSIS — Z1339 Encounter for screening examination for other mental health and behavioral disorders: Secondary | ICD-10-CM | POA: Diagnosis not present

## 2022-12-27 DIAGNOSIS — Z1331 Encounter for screening for depression: Secondary | ICD-10-CM | POA: Diagnosis not present

## 2022-12-27 DIAGNOSIS — Z23 Encounter for immunization: Secondary | ICD-10-CM | POA: Diagnosis not present

## 2023-01-31 ENCOUNTER — Encounter: Payer: Self-pay | Admitting: Family Medicine

## 2023-01-31 ENCOUNTER — Ambulatory Visit (INDEPENDENT_AMBULATORY_CARE_PROVIDER_SITE_OTHER): Payer: Commercial Managed Care - PPO | Admitting: Family Medicine

## 2023-01-31 VITALS — BP 108/72 | Ht 68.0 in | Wt 140.0 lb

## 2023-01-31 DIAGNOSIS — M79605 Pain in left leg: Secondary | ICD-10-CM | POA: Diagnosis not present

## 2023-01-31 MED ORDER — MELOXICAM 15 MG PO TABS: ORAL_TABLET | ORAL | 0 refills | Status: AC

## 2023-01-31 NOTE — Patient Instructions (Signed)
Please take your meloxicam daily for the next 7 days and then as needed afterwards  Please do your home exercises plus PT exercises that we gave you

## 2023-01-31 NOTE — Progress Notes (Signed)
PCP: Patient, No Pcp Per  Chief Complaint: Low back pain Subjective:   HPI: Patient is a 22 y.o. male here for lower back pain.  Patient states that for the past 4 to 5 days he has been dealing with some low back pain that he feels radiates into his gluteal area on the left down his hamstring and sometimes even down into his foot.  Patient states that the pain does not radiate right now further than hamstring.  Patient denies any trauma or any recent episodes of flexion/extension where he felt pain in his back.  Patient does note that he has been dealing with lower back pain for quite some time and is prone to lumbar strains.  Patient was in physical therapy 1 year ago and has been doing the at home exercises.  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  Patient denies any lower extremity weakness, no changes with bowels or bladder.  No decrease in sensation over his lower extremity.   Past Medical History:  Diagnosis Date   Allergy    Depression with anxiety April 2015    Current Outpatient Medications on File Prior to Visit  Medication Sig Dispense Refill   amoxicillin-clavulanate (AUGMENTIN) 875-125 MG tablet Take 1 tablet by mouth 2 (two) times daily unitl gone 20 tablet 1   amoxicillin-clavulanate (AUGMENTIN) 875-125 MG tablet TAKE 1 TABLET TWICE DAILY UNTIL GONE. 20 tablet 1   clindamycin (CLINDAGEL) 1 % gel      cyclobenzaprine (FLEXERIL) 5 MG tablet Take 1 tablet (5 mg total) by mouth 3 (three) times daily as needed for muscle spasms. 20 tablet 0   EPIDUO FORTE 0.3-2.5 % GEL APPLY TO ACNE EVERY NIGHT AT BEDTIME AS DIRECTED  3   minocycline (MINOCIN,DYNACIN) 50 MG capsule Take 50 mg by mouth 2 (two) times daily.  1   No current facility-administered medications on file prior to visit.    No past surgical history on file.  No Known Allergies  BP  108/72   Ht 5\' 8"  (1.727 m)   Wt 140 lb (63.5 kg)   BMI 21.29 kg/m      11/18/2020    8:53 AM  [facility]  Frequency of aerobic exercise (# of days/week) 3  Average time in minutes 5  Frequency of strengthening activities (# of days/week) 3        No data to display              Objective:  Physical Exam:  Gen: NAD, comfortable in exam room  Lumbar spine:  - Inspection: no gross deformity or scoliosis; no swelling or ecchymosis. No skin changes - Palpation: No TTP over the spinous processes, there is TTP paraspinal muscles over L4/L5, no TTP over SI joints - ROM: full active ROM of the lumbar spine in flexion and extension with moderate pain in extension.  - Strength: 5/5 strength of lower extremity in L4-S1 nerve root distributions b/l  *L1/L2: Hip Flexion & Abduction  *L3/L4: Knee Extension  *L4/L5: Ankle Dorsiflexion  *L5: Great Toe Extension  *S1: Ankle Plantar Flexion - Neuro: sensation intact in the L4-S1 nerve root distribution b/l, 2+ L4 and S1 reflexes - Provocative Testing: Negative straight leg raise    Assessment & Plan:  1. 1. Left leg pain -Patient's symptoms could be related to compression  of the sciatic nerve, patient does have some indications more consistent with a piriformis syndrome presentation however given that patient's back pain does start from the lumbar area, will focus on a lumbar disc etiology.  At this time, we will go ahead and get lumbar x-rays as well as start patient on meloxicam daily for the next 1 week, patient can then take it as needed afterwards.  Will also give patient piriformis exercises to start. -If patient has minimal improvement with meloxicam, patient was advised to MyChart [place] and can send Medrol dose pack in order to decrease the inflammation. -Patient is to follow-up in approximately 4 to 6 weeks, at that time we will reevaluate patient and if minimal improvement we will go ahead and order MRI of  the lumbar spine. - DG Lumbar Spine 2-3 Views; Future    Brenton Grills MD, PGY-4  Sports Medicine Fellow [facility]

## 2023-01-31 NOTE — Progress Notes (Incomplete Revision)
PCP: Patient, No Pcp Per  Chief Complaint: Low back pain Subjective:   HPI: Patient is a 22 y.o. male here for lower back pain.  Patient states that for the past 4 to 5 days he has been dealing with some low back pain that he feels radiates into his gluteal area on the left down his hamstring and sometimes even down into his foot.  Patient states that the pain does not radiate right now further than hamstring.  Patient denies any trauma or any recent episodes of flexion/extension where he felt pain in his back.  Patient does note that he has been dealing with lower back pain for quite some time and is prone to lumbar strains.  Patient was in physical therapy 1 year ago and has been doing the at home exercises.  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  Patient denies any lower extremity weakness, no changes with bowels or bladder.  No decrease in sensation over his lower extremity.   Past Medical History:  Diagnosis Date   Allergy    Depression with anxiety April 2015    Current Outpatient Medications on File Prior to Visit  Medication Sig Dispense Refill   amoxicillin-clavulanate (AUGMENTIN) 875-125 MG tablet Take 1 tablet by mouth 2 (two) times daily unitl gone 20 tablet 1   amoxicillin-clavulanate (AUGMENTIN) 875-125 MG tablet TAKE 1 TABLET TWICE DAILY UNTIL GONE. 20 tablet 1   clindamycin (CLINDAGEL) 1 % gel      cyclobenzaprine (FLEXERIL) 5 MG tablet Take 1 tablet (5 mg total) by mouth 3 (three) times daily as needed for muscle spasms. 20 tablet 0   EPIDUO FORTE 0.3-2.5 % GEL APPLY TO ACNE EVERY NIGHT AT BEDTIME AS DIRECTED  3   minocycline (MINOCIN,DYNACIN) 50 MG capsule Take 50 mg by mouth 2 (two) times daily.  1   No current facility-administered medications on file prior to visit.    No past surgical history on file.  No Known Allergies  BP  108/72   Ht 5\' 8"  (1.727 m)   Wt 140 lb (63.5 kg)   BMI 21.29 kg/m      11/18/2020    8:53 AM  [facility]  Frequency of aerobic exercise (# of days/week) 3  Average time in minutes 5  Frequency of strengthening activities (# of days/week) 3        No data to display              Objective:  Physical Exam:  Gen: NAD, comfortable in exam room  Lumbar spine:  - Inspection: no gross deformity or scoliosis; no swelling or ecchymosis. No skin changes - Palpation: No TTP over the spinous processes, there is TTP paraspinal muscles over L4/L5, no TTP over SI joints - ROM: full active ROM of the lumbar spine in flexion and extension with moderate pain in extension.  - Strength: 5/5 strength of lower extremity in L4-S1 nerve root distributions b/l  *L1/L2: Hip Flexion & Abduction  *L3/L4: Knee Extension  *L4/L5: Ankle Dorsiflexion  *L5: Great Toe Extension  *S1: Ankle Plantar Flexion - Neuro: sensation intact in the L4-S1 nerve root distribution b/l, 2+ L4 and S1 reflexes - Provocative Testing: Negative straight leg raise    Assessment & Plan:  1. 1. Left leg pain -Patient's symptoms could be related to compression  of the sciatic nerve, patient does have some indications more consistent with a piriformis syndrome presentation however given that patient's back pain does start from the lumbar area, will focus on a lumbar disc etiology.  At this time, we will go ahead and get lumbar x-rays as well as start patient on meloxicam daily for the next 1 week, patient can then take it as needed afterwards.  Will also give patient piriformis exercises to start. -If patient has minimal improvement with meloxicam, patient was advised to MyChart [place] and can send Medrol dose pack in order to decrease the inflammation. -Patient is to follow-up in approximately 4 to 6 weeks, at that time we will reevaluate patient and if minimal improvement we will go ahead and order MRI of  the lumbar spine. - DG Lumbar Spine 2-3 Views; Future    Brenton Grills MD, PGY-4  Sports Medicine Fellow [facility]

## 2023-02-01 ENCOUNTER — Encounter: Payer: Self-pay | Admitting: Family Medicine

## 2023-02-01 ENCOUNTER — Ambulatory Visit
Admission: RE | Admit: 2023-02-01 | Discharge: 2023-02-01 | Disposition: A | Discharge status: Home / Self Care | Payer: Commercial Managed Care - PPO | Source: Ambulatory Visit | Attending: Family Medicine | Admitting: Family Medicine

## 2023-02-01 DIAGNOSIS — M545 Low back pain, unspecified: Secondary | ICD-10-CM | POA: Diagnosis not present

## 2023-02-01 DIAGNOSIS — M79605 Pain in left leg: Secondary | ICD-10-CM

## 2023-02-28 ENCOUNTER — Other Ambulatory Visit: Payer: Self-pay | Admitting: Family Medicine

## 2023-03-02 ENCOUNTER — Other Ambulatory Visit (HOSPITAL_BASED_OUTPATIENT_CLINIC_OR_DEPARTMENT_OTHER): Payer: Self-pay

## 2023-03-02 MED ORDER — HYDROCODONE-ACETAMINOPHEN 10-325 MG PO TABS
1.0000 | ORAL_TABLET | Freq: Four times a day (QID) | ORAL | 0 refills | Status: DC | PRN
  Filled 2023-03-02: qty 15, 4d supply, fill #0

## 2023-03-02 MED ORDER — AMOXICILLIN 500 MG PO CAPS
500.0000 mg | ORAL_CAPSULE | Freq: Three times a day (TID) | ORAL | 0 refills | Status: DC
  Filled 2023-03-02: qty 15, 5d supply, fill #0

## 2023-03-19 ENCOUNTER — Ambulatory Visit: Payer: Commercial Managed Care - PPO | Admitting: Family Medicine

## 2023-03-19 VITALS — BP 114/80 | Ht 68.0 in | Wt 140.0 lb

## 2023-03-19 DIAGNOSIS — M545 Low back pain, unspecified: Secondary | ICD-10-CM | POA: Diagnosis not present

## 2023-03-19 DIAGNOSIS — M79604 Pain in right leg: Secondary | ICD-10-CM | POA: Diagnosis not present

## 2023-03-19 DIAGNOSIS — M79605 Pain in left leg: Secondary | ICD-10-CM | POA: Diagnosis not present

## 2023-03-19 MED ORDER — PREDNISONE 10 MG PO TABS: ORAL_TABLET | ORAL | 0 refills | Status: DC

## 2023-03-19 NOTE — Patient Instructions (Signed)
Take prednisone x 6 days as directed. - don't take the meloxicam while you're on this. Do home exercises most days of the week. Call us if you want to go ahead with physical therapy, if it's covered, and we'll place the referral for you. Follow up with me in 6 weeks otherwise but call sooner if you're struggling.

## 2023-03-20 ENCOUNTER — Encounter: Payer: Self-pay | Admitting: Family Medicine

## 2023-04-16 ENCOUNTER — Other Ambulatory Visit (HOSPITAL_BASED_OUTPATIENT_CLINIC_OR_DEPARTMENT_OTHER): Payer: Self-pay

## 2023-04-16 MED ORDER — AZITHROMYCIN 250 MG PO TABS
ORAL_TABLET | ORAL | 0 refills | Status: DC
  Filled 2023-04-16: qty 6, 5d supply, fill #0

## 2023-06-28 ENCOUNTER — Ambulatory Visit
Admission: RE | Admit: 2023-06-28 | Discharge: 2023-06-28 | Disposition: A | Discharge status: Home / Self Care | Source: Ambulatory Visit | Attending: Family Medicine | Admitting: Family Medicine

## 2023-06-28 ENCOUNTER — Ambulatory Visit: Admitting: Family Medicine

## 2023-06-28 ENCOUNTER — Ambulatory Visit: Payer: Self-pay

## 2023-06-28 VITALS — BP 104/72 | Ht 68.0 in | Wt 140.0 lb

## 2023-06-28 DIAGNOSIS — M542 Cervicalgia: Secondary | ICD-10-CM | POA: Diagnosis not present

## 2023-06-28 DIAGNOSIS — G8929 Other chronic pain: Secondary | ICD-10-CM

## 2023-06-28 DIAGNOSIS — M25511 Pain in right shoulder: Secondary | ICD-10-CM | POA: Diagnosis not present

## 2023-06-28 NOTE — Patient Instructions (Signed)
 Get x-rays of your scapula - we will call you with results. Start physical therapy at [facility], do home exercises on days you don't go to therapy. Aleve with food for pain and inflammation only if needed. Consider cervical collar if severely painful. Simple range of motion exercises within limits of pain to prevent further stiffness. Heat 15 minutes at a time 3-4 times a day to help with spasms. Watch head position when on computers, texting, when sleeping in bed - should in line with back to prevent further spasms/strain. If not improving we will consider an MRI of your cervical spine Follow up with me in 5-6 weeks.

## 2023-06-29 ENCOUNTER — Encounter: Payer: Self-pay | Admitting: Family Medicine

## 2023-06-29 NOTE — Progress Notes (Signed)
 PCP: Patient, No Pcp Per  Subjective:   HPI: Patient is a 23 y.o. male here for neck pain.  Patient reports that his back pain has improved following rehab exercises, home exercises, and switch to a standing desk. 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 He reports for a long period of time he has had some tightening in soreness in his neck and trapezius areas. Has had knots he has noticed the right lateral aspect of his trapezius and on the left cervical paraspinal area just inferior to the base of the skull. No radiation into his extremities. No numbness or tingling. He has had dry needling in the past and reports 1 of these needles actually bent when he tried to have 1 on the right side needled. Of note he has a remote history of a right clavicle fracture that healed. No prior history of scapular fracture or other spinous injury.  Past Medical History:  Diagnosis Date   Allergy    Depression with anxiety April 2015    Current Outpatient Medications on File Prior to Visit  Medication Sig Dispense Refill   amoxicillin (AMOXIL) 500 MG capsule Take 1 capsule (500 mg total) by mouth 3 (three) times daily until finished. 15 capsule 0   amoxicillin-clavulanate (AUGMENTIN) 875-125 MG tablet Take 1 tablet by mouth 2 (two) times daily unitl gone 20 tablet 1   amoxicillin-clavulanate (AUGMENTIN) 875-125 MG tablet TAKE 1 TABLET TWICE DAILY UNTIL GONE. 20 tablet 1   azithromycin (ZITHROMAX) 250 MG tablet Take two tablets day one and then once daily for 4 days 6 tablet 0   clindamycin (CLINDAGEL) 1 % gel      cyclobenzaprine (FLEXERIL) 5 MG tablet Take 1 tablet (5 mg total) by mouth 3 (three) times daily as needed for muscle spasms. 20 tablet 0   EPIDUO FORTE 0.3-2.5 % GEL APPLY TO ACNE EVERY NIGHT AT BEDTIME AS DIRECTED  3   HYDROcodone-acetaminophen (NORCO) 10-325 MG tablet Take 1 tablet by mouth every 6 (six) hours as needed for pain. 15 tablet 0   meloxicam  (MOBIC) 15 MG tablet Take 1 tablet by mouth daily with food for 7 days. Then take as needed. 30 tablet 0   minocycline (MINOCIN,DYNACIN) 50 MG capsule Take 50 mg by mouth 2 (two) times daily.  1   predniSONE (DELTASONE) 10 MG tablet 6 tabs po day 1, 5 tabs po day 2, 4 tabs po day 3, 3 tabs po day 4, 2 tabs po day 5, 1 tab po day 6 21 tablet 0   No current facility-administered medications on file prior to visit.    History reviewed. No pertinent surgical history.  No Known Allergies  BP 104/72   Ht 5\' 8"  (1.727 m)   Wt 140 lb (63.5 kg)   BMI 21.29 kg/m      11/18/2020    8:53 AM  [facility]  Frequency of aerobic exercise (# of days/week) 3  Average time in minutes 5  Frequency of strengthening activities (# of days/week) 3        No data to display              Objective:  Physical Exam:  Gen: NAD, comfortable in exam room  Neck: Palpable firm nodule deep to lateral trapezius.  Very small mobile nodule felt inferior to right base of skull. No other gross deformity, swelling, bruising. TTP bilateral paraspinal regions and trapezius.  No midline/bony  TTP. FROM. BUE strength 5/5.   Sensation intact to light touch.   NV intact distal BUEs.   Limited MSK u/s neck: Area of firm nodule in the right lateral trapezius corresponds to a bony prominence from the scapula, possible exostosis versus healed prior fracture.  Area of small nodule at base of left skull is consistent with a small cyst.  No neovascularity within or surrounding either of the structures. Assessment & Plan:  1.  Neck pain: We will obtain radiographs of his right scapula the ultrasound is reassuring for both of the nodules that he has noticed.  Consistent with cervical and trapezius strains from overuse.  He will start physical therapy and home exercises for this.  Aleve, heat, ergonomic issues discussed.  Follow-up in 5 to 6 weeks.  Consider MRI of cervical spine if not improving.

## 2023-07-04 ENCOUNTER — Other Ambulatory Visit: Payer: Self-pay

## 2023-07-04 ENCOUNTER — Encounter: Payer: Self-pay | Admitting: Physical Therapy

## 2023-07-04 ENCOUNTER — Ambulatory Visit: Attending: Family Medicine | Admitting: Physical Therapy

## 2023-07-04 DIAGNOSIS — R252 Cramp and spasm: Secondary | ICD-10-CM | POA: Insufficient documentation

## 2023-07-04 DIAGNOSIS — M5459 Other low back pain: Secondary | ICD-10-CM | POA: Diagnosis not present

## 2023-07-04 DIAGNOSIS — M542 Cervicalgia: Secondary | ICD-10-CM | POA: Insufficient documentation

## 2023-07-04 DIAGNOSIS — R293 Abnormal posture: Secondary | ICD-10-CM | POA: Insufficient documentation

## 2023-07-04 NOTE — Patient Instructions (Signed)

## 2023-07-04 NOTE — Therapy (Signed)
 OUTPATIENT PHYSICAL THERAPY CERVICAL EVALUATION   Patient Name: Kevin Lane MRN: [id number] DOB:[birth year], 23 y.o., male Today's Date: 07/04/2023  END OF SESSION:  PT End of Session - 07/04/23 1741     Visit Number 1    Date for PT Re-Evaluation 08/29/23    Authorization Type [facility] [employer]    PT Start Time 1106    PT Stop Time 1148    PT Time Calculation (min) 42 min    Activity Tolerance Patient tolerated treatment well    Behavior During Therapy Kevin Lane for tasks assessed/performed             Past Medical History:  Diagnosis Date   Allergy    Depression with anxiety April 2015   History reviewed. No pertinent surgical history. 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   Irritation of right ulnar nerve 12/23/2018   Encounter for routine child health examination without abnormal findings 10/24/2016   BMI (body mass index), pediatric, 5% to less than 85% for age 38/09/2013   Depression with anxiety 06/24/2013   Inflammatory acne 06/24/2013    PCP: None  REFERRING PROVIDER: Salina Craver, MD  REFERRING DIAG: M54.2 (ICD-10-CM) - Neck pain  THERAPY DIAG:  Cervicalgia  Other low back pain  Abnormal posture  Cramp and spasm  Rationale for Evaluation and Treatment: Rehabilitation  ONSET DATE: 05/30/2023  SUBJECTIVE:                                                                                                                                                                                                         SUBJECTIVE STATEMENT: Patient presents with neck pain at the base of skull that started 4-5 weeks ago. He feels he has been tensing a lot from work and other stressors. He feels he has lost some ROM since the flare up. 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 He has been to [facility] in the past and he feels he got his back stronger and he felt better. He still does his exercises and if he is not consistent the pain gets worse. He has had occasionally dull pains radiating down his arms. With his current job he has long work days and he has developed sciatic nerve pain on his left side. He attributes this to his bulging disk and  piriformis muscle. He got a standing desk for work and that has helped his pain. He takes a break at work every 15 minutes. Currently sciatic nerve pain is worse than neck pain.  His current exercise program includes: prone on forearms; full cobra; bird dogs; trunk rotation strengthening; iso shoulder abduction + flexion; shoulder abduction; Y's at wall; shoulder row Hand dominance: Right  PERTINENT HISTORY:  Depression; anxiety; hx of cervical and lumbar pain  PAIN:  Are you having pain? Yes: NPRS scale: 3 (currently) 10(worst) Pain location: base of skull; left side lumbar ; bilateral upper trap Pain description: tight; dull; sore. Feels like someone poured lava down arms Aggravating factors: sitting for work;  Relieving factors: salonpas; tiger balm; laying down  PRECAUTIONS: None  RED FLAGS: None     WEIGHT BEARING RESTRICTIONS: No  FALLS:  Has patient fallen in last 6 months? 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  DIAGNOSTIC FINDINGS:  None  PATIENT SURVEYS:  NDI 22/50 44%  COGNITION: Overall cognitive status:  Within functional limits for tasks assessed  SENSATION: Radiating pain down bilateral arms and   POSTURE: rounded shoulders and forward head  PALPATION: Increased muscle spasms bilateral upper trap & suboccipitals   CERVICAL ROM: WFL patient feels tightness end range cervical rotation    UPPER EXTREMITY ROM: WFL    UPPER EXTREMITY WUJ:[id number] 4-4+/5     FUNCTIONAL TESTS: None tested at eval   TREATMENT DATE:  07/04/2023 Initial Evaluation & HEP created                                                                                                                              Patient education on correct standing posture, relaxation techniques, how stress from work can impact pain levels.   PATIENT EDUCATION:  Education details: See above; POC; HEP Person educated: Patient Education method: Explanation, Demonstration, and Handouts Education comprehension: verbalized understanding, returned demonstration, and needs further education  HOME EXERCISE PROGRAM: Access Code: [email or web address] URL: [email or web address] Date: 07/04/2023 Prepared by: Kevin Lane  Exercises - Supine Diaphragmatic Breathing  - 3 x daily - 7 x weekly - 2 sets - 20-30 hold - Supine Hamstring Stretch with Strap  - 3 x daily - 7 x weekly - 2 sets - 20-30 hold - Supine ITB Stretch with Strap  - 3 x daily - 7 x weekly - 2 sets - 20-30 hold - Seated Piriformis Stretch with Trunk Bend  - 3 x daily - 7 x weekly - 2 sets - 20-30 hold -  Supine Figure 4 Piriformis Stretch  - 3 x daily - 7 x weekly - 2 sets - 20-30 hold  Proper standing alignment at desk  ASSESSMENT:  CLINICAL IMPRESSION: Patient is a 23 y.o. male who was seen today for physical therapy evaluation and treatment for neck pain. Kevin Lane presents to physical therapy with bilateral neck pain and sciatic nerve pain that started 4-5 weeks ago. He has had back pain before, but with the stressors from his job he feels he is tense all the  time. Provided extensive education about how increased stress from work and long hours sitting can exacerbate pain levels. Patient understood this. Patient has an extensive home exercise program from previous bouts of physical therapy. Educated patient on relaxation techniques using diaphragmatic breathing and stretches he can perform to improve hip mobility. Demonstrated posture patient should have while at his standing desk and provided an example picture. Patient is motivated and want to relief this pain he is having. Patient will benefit from skilled PT to address the below impairments and improve overall function.    OBJECTIVE IMPAIRMENTS: decreased ROM, decreased strength, increased fascial restrictions, increased muscle spasms, impaired flexibility, improper body mechanics, postural dysfunction, and pain.   ACTIVITY LIMITATIONS: lifting, sitting, standing, sleeping, and reach over head  PARTICIPATION LIMITATIONS: interpersonal relationship, driving, community activity, and occupation  PERSONAL FACTORS: Profession, Time since onset of injury/illness/exacerbation, and 1-2 comorbidities: depression; anxiety  are also affecting patient's functional outcome.   REHAB POTENTIAL: Good  CLINICAL DECISION MAKING: Evolving/moderate complexity  EVALUATION COMPLEXITY: Moderate   GOALS: Goals reviewed with patient? Yes  SHORT TERM GOALS: Target date: 08/01/2023  Patient will be independent with initial HEP. Baseline:  Goal status: INITIAL  2.  Patient will report > or = to 30% improvement in neck/ back  pain since starting PT. Baseline:  Goal status: INITIAL   3.  Patient will demonstrate correct seated and standing posture for proper cervical alignment. Baseline:  Goal status: INITIAL  LONG TERM GOALS: Target date: 08/29/2023  Patient will demonstrate independence in advanced HEP. Baseline:  Goal status: INITIAL  2.  Patient will report > or = to 70% improvement in neck/ back pain  since starting PT. Baseline:  Goal status: INITIAL  3.  Patient will verbalize and demonstrate self-care strategies to manage pain including tissue mobility practices and change of position. Baseline:  Goal status: INITIAL   4.  Patient will be able to complete work day with < or = to 2/10 pain.  Baseline:  Goal status: INITIAL     PLAN:  PT FREQUENCY: 2x/week  PT DURATION: 8 weeks  PLANNED INTERVENTIONS: 97164- PT Re-evaluation, 97110-Therapeutic exercises, 97530- Therapeutic activity, 97112- Neuromuscular re-education, 97535- Self Care, 95284- Manual therapy, [phone]- Canalith repositioning, [id number]- Aquatic Therapy, [phone]- Electrical stimulation (unattended), [phone]- Electrical stimulation (manual), 97016- Vasopneumatic device, [id number]- Ultrasound, [id number]- Traction (mechanical), [id number]- Ionotophoresis 4mg /ml Dexamethasone, Balance training, Stair training, Taping, Dry Needling, Joint mobilization, Joint manipulation, Spinal manipulation, Spinal mobilization, Manual lymph drainage, Scar mobilization, Vestibular training, Cryotherapy, and Moist heat  PLAN FOR NEXT SESSION: Review HEP; relaxation techniques; DN (suboccipitals & upper traps) ; thoracic mobility & hip flexibility    Kevin Lane, PT 07/04/23 5:42 PM [facility] [address] [place], [place] [address] Phone # [phone] Fax [phone]

## 2023-07-12 ENCOUNTER — Ambulatory Visit: Admitting: Physical Therapy

## 2023-07-12 ENCOUNTER — Encounter: Payer: Self-pay | Admitting: Physical Therapy

## 2023-07-12 DIAGNOSIS — M542 Cervicalgia: Secondary | ICD-10-CM

## 2023-07-12 DIAGNOSIS — M5459 Other low back pain: Secondary | ICD-10-CM | POA: Diagnosis not present

## 2023-07-12 DIAGNOSIS — R293 Abnormal posture: Secondary | ICD-10-CM

## 2023-07-12 DIAGNOSIS — R252 Cramp and spasm: Secondary | ICD-10-CM | POA: Diagnosis not present

## 2023-07-12 NOTE — Therapy (Signed)
 OUTPATIENT PHYSICAL THERAPY CERVICAL TREATMENT   Patient Name: Kevin Lane MRN: [id number] DOB:[birth year], 23 y.o., male Today's Date: 07/12/2023  END OF SESSION:  PT End of Session - 07/12/23 0857     Visit Number 2    Date for PT Re-Evaluation 08/29/23    Authorization Type [facility] [employer]    PT Start Time 1959    PT Stop Time 2049    PT Time Calculation (min) 50 min    Activity Tolerance Patient tolerated treatment well    Behavior During Therapy [facility] for tasks assessed/performed              Past Medical History:  Diagnosis Date   Allergy    Depression with anxiety April 2015   History reviewed. No pertinent surgical history. 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   Irritation of right ulnar nerve 12/23/2018   Encounter for routine child health examination without abnormal findings 10/24/2016   BMI (body mass index), pediatric, 5% to less than 85% for age 67/09/2013   Depression with anxiety 06/24/2013   Inflammatory acne 06/24/2013    PCP: None  REFERRING PROVIDER: Salina Craver, MD  REFERRING DIAG: M54.2 (ICD-10-CM) - Neck pain  THERAPY DIAG:  Cervicalgia  Other low back pain  Abnormal posture  Cramp and spasm  Rationale for Evaluation and Treatment: Rehabilitation  ONSET DATE: 05/30/2023  SUBJECTIVE:                                                                                                                                                                                                         SUBJECTIVE STATEMENT: Patient reports he is doing good today. He did not have to animate over the weekend so his pain has not been bad. He has been semi-compliant with HEP.   From Eval: Patient presents with neck pain at the base of skull that started 4-5 weeks ago. He feels he has been tensing a lot  from work and other stressors. He feels he has lost some ROM since the flare up. 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 He has been to [facility] in the past and he feels he got his back stronger and he felt better. He still does his exercises and if he is not consistent the pain gets worse. He has had occasionally  dull pains radiating down his arms. With his current job he has long work days and he has developed sciatic nerve pain on his left side. He attributes this to his bulging disk and piriformis muscle. He got a standing desk for work and that has helped his pain. He takes a break at work every 15 minutes. Currently sciatic nerve pain is worse than neck pain.  His current exercise program includes: prone on forearms; full cobra; bird dogs; trunk rotation strengthening; iso shoulder abduction + flexion; shoulder abduction; Y's at wall; shoulder row Hand dominance: Right  PERTINENT HISTORY:  Depression; anxiety; hx of cervical and lumbar pain  PAIN:  Are you having pain? Yes: NPRS scale: 3 (currently) 10(worst) Pain location: base of skull; left side lumbar ; bilateral upper trap Pain description: tight; dull; sore. Feels like someone poured lava down arms Aggravating factors: sitting for work;  Relieving factors: salonpas; tiger balm; laying down  PRECAUTIONS: None  RED FLAGS: None     WEIGHT BEARING RESTRICTIONS: No  FALLS:  Has patient fallen in last 6 months? 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  DIAGNOSTIC FINDINGS:  None  PATIENT SURVEYS:  NDI 22/50 44%  COGNITION: Overall cognitive status: Within functional limits for tasks assessed  SENSATION: Radiating pain down bilateral arms and   POSTURE: rounded shoulders and forward head  PALPATION: Increased muscle spasms bilateral upper trap & suboccipitals   CERVICAL ROM: WFL patient feels tightness end range cervical rotation    UPPER EXTREMITY ROM: WFL   UPPER EXTREMITY JXB:JYNWGNF 4-4+/5    FUNCTIONAL TESTS: None tested at eval   TREATMENT DATE:  07/12/2023 UBE Level 1.5 3/3 6 mins - PT present to discuss status Hamstring stretch with green strap 2 x 30 sec bilateral  ITB stretch with green strap 2 x 30 sec bilateral Supine piriformis stretch 2 x 30 sec bilateral  Open Books x 8 each direction Cat Cow x 8 Melt Method x15 (flexion & rotation) 3 way stability ball stretch x 8 each direction  Trigger Point Dry Needling  Initial Treatment: Pt instructed on Dry Needling rational, procedures, and possible side effects. Pt instructed to expect mild to moderate muscle soreness later in the day and/or into the next day.  Pt instructed in methods to reduce muscle soreness. Pt instructed to continue prescribed HEP. Patient verbalized understanding of these instructions and education.   Patient Verbal Consent Given: Yes Education Handout Provided: Previously Provided Muscles Treated: bilateral upper traps Electrical Stimulation Performed: No Treatment Response/Outcome: Utilized skilled palpation to identify trigger points.  During dry needling able to palpate muscle twitch and muscle elongation   Manual STM performed after dry needling    07/04/2023 Initial Evaluation & HEP created  Patient education on correct standing posture,  relaxation techniques, how stress from work can impact pain levels.   PATIENT EDUCATION:  Education details: See above; POC; HEP Person educated: Patient Education method: Explanation, Demonstration, and Handouts Education comprehension: verbalized understanding, returned demonstration, and needs further education  HOME EXERCISE PROGRAM: Access Code: [email or web address] URL: [email or web address] Date: 07/04/2023 Prepared by: Penelope Bowie  Exercises - Supine Diaphragmatic Breathing  - 3 x daily - 7 x weekly - 2 sets - 20-30 hold - Supine Hamstring Stretch with Strap  - 3 x daily - 7 x weekly - 2 sets - 20-30 hold - Supine ITB Stretch with Strap  - 3 x daily - 7 x weekly - 2 sets - 20-30 hold - Seated Piriformis Stretch with Trunk Bend  - 3 x daily - 7 x weekly - 2 sets - 20-30 hold - Supine Figure 4 Piriformis Stretch  - 3 x daily - 7 x weekly - 2 sets - 20-30 hold  Proper standing alignment at desk  ASSESSMENT:  CLINICAL IMPRESSION: Kevin Lane presents to therapy for first follow up visit after evaluation. He verbalized that his pain levels have been better since he did not work this weekend. He has been semi compliant with HEP and diaphragmatic breathing has been really helpful for him. Reviewed HEP and patient required minimal verbal cues for exercise performance. Overall, patient tolerated treatment session well and should progress well with therapy. Noted increase muscle twitches in Rt upper trap. Educated patient to stay hydrated and do HEP stretches. Patient will benefit from skilled PT to address the below impairments and improve overall function.     OBJECTIVE IMPAIRMENTS: decreased ROM, decreased strength, increased fascial restrictions, increased muscle spasms, impaired flexibility, improper body mechanics, postural dysfunction, and pain.   ACTIVITY LIMITATIONS: lifting, sitting, standing, sleeping, and reach over head  PARTICIPATION LIMITATIONS: interpersonal  relationship, driving, community activity, and occupation  PERSONAL FACTORS: Profession, Time since onset of injury/illness/exacerbation, and 1-2 comorbidities: depression; anxiety  are also affecting patient's functional outcome.   REHAB POTENTIAL: Good  CLINICAL DECISION MAKING: Evolving/moderate complexity  EVALUATION COMPLEXITY: Moderate   GOALS: Goals reviewed with patient? Yes  SHORT TERM GOALS: Target date: 08/01/2023  Patient will be independent with initial HEP. Baseline:  Goal status: INITIAL  2.  Patient will report > or = to 30% improvement in neck/ back  pain since starting PT. Baseline:  Goal status: INITIAL   3.  Patient will demonstrate correct seated and standing posture for proper cervical alignment. Baseline:  Goal status: INITIAL  LONG TERM GOALS: Target date: 08/29/2023  Patient will demonstrate independence in advanced HEP. Baseline:  Goal status: INITIAL  2.  Patient will report > or = to 70% improvement in neck/ back pain since starting PT. Baseline:  Goal status: INITIAL  3.  Patient will verbalize and demonstrate self-care strategies to manage pain including tissue mobility practices and change of position. Baseline:  Goal status: INITIAL   4.  Patient will be able to complete work day with < or = to 2/10 pain.  Baseline:  Goal status: INITIAL     PLAN:  PT FREQUENCY: 2x/week  PT DURATION: 8 weeks  PLANNED INTERVENTIONS: 97164- PT Re-evaluation, 97110-Therapeutic exercises, 97530- Therapeutic activity, [id number]- Neuromuscular re-education, 97535- Self Care, 54098- Manual therapy, [id number]- Canalith repositioning, [id number]- Aquatic Therapy, J1914- Electrical stimulation (unattended), [id number]- Electrical stimulation (manual), [id number]- Vasopneumatic device, [id number]- Ultrasound, [id number]- Traction (mechanical), [id number]- Ionotophoresis 4mg /ml Dexamethasone, Balance training, Stair training, Taping, Dry Needling, Joint mobilization, Joint  manipulation, Spinal  manipulation, Spinal mobilization, Manual lymph drainage, Scar mobilization, Vestibular training, Cryotherapy, and Moist heat  PLAN FOR NEXT SESSION: Assess response to DN #1; HEP compliance; Update with thoracic mobility ; postural strengthening;  DN (suboccipitals & upper traps) ; thoracic mobility & hip flexibility    Penelope Bowie, PT 07/12/23 9:01 AM [facility] [address] [place], [place] [address] Phone # [phone] Fax [phone]

## 2023-07-16 ENCOUNTER — Encounter: Payer: Self-pay | Admitting: Family Medicine

## 2023-07-17 ENCOUNTER — Ambulatory Visit: Admitting: Physical Therapy

## 2023-07-20 ENCOUNTER — Ambulatory Visit: Attending: Family Medicine | Admitting: Physical Therapy

## 2023-07-20 ENCOUNTER — Encounter: Payer: Self-pay | Admitting: Physical Therapy

## 2023-07-20 DIAGNOSIS — R252 Cramp and spasm: Secondary | ICD-10-CM | POA: Insufficient documentation

## 2023-07-20 DIAGNOSIS — R293 Abnormal posture: Secondary | ICD-10-CM | POA: Insufficient documentation

## 2023-07-20 DIAGNOSIS — M5459 Other low back pain: Secondary | ICD-10-CM | POA: Diagnosis not present

## 2023-07-20 DIAGNOSIS — M542 Cervicalgia: Secondary | ICD-10-CM | POA: Diagnosis not present

## 2023-07-20 NOTE — Therapy (Signed)
 OUTPATIENT PHYSICAL THERAPY CERVICAL TREATMENT   Patient Name: [name] MRN: [id number] DOB:Feb 07, 2001, 23 y.o., male Today's Date: 07/20/2023  END OF SESSION:  PT End of Session - 07/20/23 1038     Visit Number 3    Date for PT Re-Evaluation 08/29/23    Authorization Type [facility] [employer]    PT Start Time 0932    PT Stop Time 1019    PT Time Calculation (min) 47 min    Activity Tolerance Patient tolerated treatment well    Behavior During Therapy [facility] for tasks assessed/performed               Past Medical History:  Diagnosis Date   Allergy    Depression with anxiety April 2015   History reviewed. No pertinent surgical history. 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   Irritation of right ulnar nerve 12/23/2018   Encounter for routine child health examination without abnormal findings 10/24/2016   BMI (body mass index), pediatric, 5% to less than 85% for age 59/09/2013   Depression with anxiety 06/24/2013   Inflammatory acne 06/24/2013    PCP: None  REFERRING PROVIDER: Salina Craver, MD  REFERRING DIAG: M54.2 (ICD-10-CM) - Neck pain  THERAPY DIAG:  Cervicalgia  Other low back pain  Abnormal posture  Cramp and spasm  Rationale for Evaluation and Treatment: Rehabilitation  ONSET DATE: 05/30/2023  SUBJECTIVE:                                                                                                                                                                                                         SUBJECTIVE STATEMENT: Patient reports he is having increased left low back pain today. He has been moving around a lot over the last few days. He feels he has been having a lot of neck tension. Pain 3.5-4/10.   From Eval: Patient presents with neck pain at the base of skull that started 4-5 weeks  ago. He feels he has been tensing a lot from work and other stressors. He feels he has lost some ROM since the flare up. 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 He has been to [facility] in the past and he feels he got his back stronger and he felt better. He still does his exercises and if he is not consistent  the pain gets worse. He has had occasionally dull pains radiating down his arms. With his current job he has long work days and he has developed sciatic nerve pain on his left side. He attributes this to his bulging disk and piriformis muscle. He got a standing desk for work and that has helped his pain. He takes a break at work every 15 minutes. Currently sciatic nerve pain is worse than neck pain.  His current exercise program includes: prone on forearms; full cobra; bird dogs; trunk rotation strengthening; iso shoulder abduction + flexion; shoulder abduction; Y's at wall; shoulder row Hand dominance: Right  PERTINENT HISTORY:  Depression; anxiety; hx of cervical and lumbar pain  PAIN:  Are you having pain? Yes: NPRS scale: 3 (currently) 10(worst) Pain location: base of skull; left side lumbar ; bilateral upper trap Pain description: tight; dull; sore. Feels like someone poured lava down arms Aggravating factors: sitting for work;  Relieving factors: salonpas; tiger balm; laying down  PRECAUTIONS: None  RED FLAGS: None     WEIGHT BEARING RESTRICTIONS: No  FALLS:  Has patient fallen in last 6 months? 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  DIAGNOSTIC FINDINGS:  None  PATIENT SURVEYS:  NDI 22/50 44%  COGNITION: Overall cognitive status: Within functional limits for tasks assessed  SENSATION: Radiating pain down bilateral arms and   POSTURE: rounded shoulders and forward head  PALPATION: Increased muscle spasms bilateral upper trap & suboccipitals   CERVICAL ROM: WFL patient feels tightness end range cervical rotation    UPPER EXTREMITY ROM: WFL   UPPER EXTREMITY ZOX:WRUEAVW 4-4+/5    FUNCTIONAL TESTS: None tested at eval   TREATMENT DATE:  07/20/2023 UBE Level 2.0 3/3 6 mins - PT present to discuss status Childs Pose x 20sec Childs Pose + sidebend x 20sec bilateral  QL door way stretch 2 x 20 sec each Hamstring stretch with green strap 2 x 30 sec bilateral  ITB stretch with green strap 2 x 30 sec bilateral Supine piriformis stretch 2 x 30 sec bilateral  Melt Method x20 (flexion & rotation) Manual suboccipital release  Trigger Point Dry Needling  Subsequent Treatment: Instructions provided previously at initial dry needling treatment.   Patient Verbal Consent Given: Yes Education Handout Provided: Previously Provided Muscles Treated: bilateral upper traps Electrical Stimulation Performed: No Treatment Response/Outcome: Utilized skilled palpation to identify trigger points.  During dry needling able to palpate muscle twitch and muscle elongation      07/12/2023 UBE Level 1.5 3/3 6 mins - PT present to discuss status Hamstring stretch with green strap 2 x 30 sec bilateral  ITB stretch with green strap 2 x 30 sec bilateral 3 way stability ball stretch x 8 each direction Supine piriformis stretch 2 x 30 sec bilateral  Open Books x 8 each direction Cat Cow x 8 Melt Method x15 (flexion & rotation) 3 way stability ball stretch x 8 each direction  Trigger Point Dry Needling  Initial Treatment: Pt  instructed on Dry Needling rational, procedures, and possible side effects. Pt instructed to expect  mild to moderate muscle soreness later in the day and/or into the next day.  Pt instructed in methods to reduce muscle soreness. Pt instructed to continue prescribed HEP. Patient verbalized understanding of these instructions and education.   Patient Verbal Consent Given: Yes Education Handout Provided: Previously Provided Muscles Treated: bilateral upper traps Electrical Stimulation Performed: No Treatment Response/Outcome: Utilized skilled palpation to identify trigger points.  During dry needling able to palpate muscle twitch and muscle elongation   Manual STM performed after dry needling    07/04/2023 Initial Evaluation & HEP created                                                                                                                              Patient education on correct standing posture, relaxation techniques, how stress from work can impact pain levels.   PATIENT EDUCATION:  Education details: See above; POC; HEP Person educated: Patient Education method: Explanation, Demonstration, and Handouts Education comprehension: verbalized understanding, returned demonstration, and needs further education  HOME EXERCISE PROGRAM: Access Code: [email or web address] URL: [email or web address] Date: 07/20/2023 Prepared by: Penelope Bowie  Exercises - Supine Diaphragmatic Breathing  - 3 x daily - 7 x weekly - 2 sets - 20-30 hold - Supine Hamstring Stretch with Strap  - 3 x daily - 7 x weekly - 2 sets - 20-30 hold - Supine ITB Stretch with Strap  - 3 x daily - 7 x weekly - 2 sets - 20-30 hold - Seated Piriformis Stretch with Trunk Bend  - 3 x daily - 7 x weekly - 2 sets - 20-30 hold - Supine Figure 4 Piriformis Stretch  - 3 x daily - 7 x weekly - 2 sets - 20-30 hold - Standing Quadratus Lumborum Stretch with Doorway  - 1 x daily - 7 x weekly - 2 sets - 20 hold - Child's Pose  Stretch  - 1 x daily - 7 x weekly - 1-2 sets - 20 hold - Child's Pose with Sidebending  - 1 x daily - 7 x weekly - 1-2 sets - 20 hold  Proper standing alignment at desk  ASSESSMENT:  CLINICAL IMPRESSION: Kevin Lane presents to therapy with increased low back pain. He has been moving around a lot and running errands and he feels that may have contributed to his increased pain. He verbalized relief from dry needling last session, but he feels like he is holding tension in his neck. Dry needling was successful at elongating upper trap muscles. Patient verbalized relief afterwards. Updated HEP to include lumbar flexibility exercises. Patient required verbal and visual cues for correct exercise performance. Patient will benefit from skilled PT to address the below impairments and improve overall function.      OBJECTIVE IMPAIRMENTS: decreased ROM, decreased strength, increased fascial restrictions, increased muscle spasms, impaired flexibility, improper body mechanics, postural dysfunction, and pain.   ACTIVITY LIMITATIONS: lifting, sitting, standing, sleeping, and reach over head  PARTICIPATION LIMITATIONS: interpersonal relationship, driving, community  activity, and occupation  PERSONAL FACTORS: Profession, Time since onset of injury/illness/exacerbation, and 1-2 comorbidities: depression; anxiety  are also affecting patient's functional outcome.   REHAB POTENTIAL: Good  CLINICAL DECISION MAKING: Evolving/moderate complexity  EVALUATION COMPLEXITY: Moderate   GOALS: Goals reviewed with patient? Yes  SHORT TERM GOALS: Target date: 08/01/2023  Patient will be independent with initial HEP. Baseline:  Goal status: INITIAL  2.  Patient will report > or = to 30% improvement in neck/ back  pain since starting PT. Baseline:  Goal status: INITIAL   3.  Patient will demonstrate correct seated and standing posture for proper cervical alignment. Baseline:  Goal status: INITIAL  LONG TERM  GOALS: Target date: 08/29/2023  Patient will demonstrate independence in advanced HEP. Baseline:  Goal status: INITIAL  2.  Patient will report > or = to 70% improvement in neck/ back pain since starting PT. Baseline:  Goal status: INITIAL  3.  Patient will verbalize and demonstrate self-care strategies to manage pain including tissue mobility practices and change of position. Baseline:  Goal status: INITIAL   4.  Patient will be able to complete work day with < or = to 2/10 pain.  Baseline:  Goal status: INITIAL     PLAN:  PT FREQUENCY: 2x/week  PT DURATION: 8 weeks  PLANNED INTERVENTIONS: 97164- PT Re-evaluation, 97110-Therapeutic exercises, 97530- Therapeutic activity, 97112- Neuromuscular re-education, 97535- Self Care, 81191- Manual therapy, [phone]- Canalith repositioning, [id number]- Aquatic Therapy, [phone]- Electrical stimulation (unattended), [phone]- Electrical stimulation (manual), 97016- Vasopneumatic device, [id number]- Ultrasound, [id number]- Traction (mechanical), [id number]- Ionotophoresis 4mg /ml Dexamethasone, Balance training, Stair training, Taping, Dry Needling, Joint mobilization, Joint manipulation, Spinal manipulation, Spinal mobilization, Manual lymph drainage, Scar mobilization, Vestibular training, Cryotherapy, and Moist heat  PLAN FOR NEXT SESSION: Assess response to DN #2 and pain levels; postural and strengthening;  DN/manual as indicated  (suboccipitals & upper traps)    Penelope Bowie, PT 07/20/23 10:41 AM [facility] [address], [address] [place], [place] [address] Phone # [phone] Fax [phone]

## 2023-07-25 ENCOUNTER — Telehealth: Payer: Self-pay | Admitting: Physical Therapy

## 2023-07-25 ENCOUNTER — Ambulatory Visit: Admitting: Physical Therapy

## 2023-07-25 NOTE — Telephone Encounter (Signed)
 Left patient a message about missed appointment today, and reminded patient of next scheduled appointment Friday, 5/9 10:15 am. Left a call back number for clinic.    Penelope Bowie, PT 07/25/23 12:13 PM

## 2023-07-27 ENCOUNTER — Encounter: Payer: Self-pay | Admitting: Physical Therapy

## 2023-07-27 ENCOUNTER — Ambulatory Visit: Admitting: Physical Therapy

## 2023-07-27 DIAGNOSIS — R293 Abnormal posture: Secondary | ICD-10-CM | POA: Diagnosis not present

## 2023-07-27 DIAGNOSIS — M542 Cervicalgia: Secondary | ICD-10-CM

## 2023-07-27 DIAGNOSIS — R252 Cramp and spasm: Secondary | ICD-10-CM

## 2023-07-27 DIAGNOSIS — M5459 Other low back pain: Secondary | ICD-10-CM | POA: Diagnosis not present

## 2023-07-27 NOTE — Therapy (Signed)
 OUTPATIENT PHYSICAL THERAPY CERVICAL TREATMENT   Patient Name: Kevin Lane MRN: [id number] DOB:07/11/2000, 23 y.o., male Today's Date: 07/27/2023  END OF SESSION:  PT End of Session - 07/27/23 1152     Visit Number 4    Date for PT Re-Evaluation 08/29/23    Authorization Type [facility] [employer]    PT Start Time 1015    PT Stop Time 1100    PT Time Calculation (min) 45 min    Activity Tolerance Patient tolerated treatment well    Behavior During Therapy [facility] for tasks assessed/performed                Past Medical History:  Diagnosis Date   Allergy    Depression with anxiety April 2015   History reviewed. No pertinent surgical history. 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   Irritation of right ulnar nerve 12/23/2018   Encounter for routine child health examination without abnormal findings 10/24/2016   BMI (body mass index), pediatric, 5% to less than 85% for age 39/09/2013   Depression with anxiety 06/24/2013   Inflammatory acne 06/24/2013    PCP: None  REFERRING PROVIDER: Salina Craver, MD  REFERRING DIAG: M54.2 (ICD-10-CM) - Neck pain  THERAPY DIAG:  Cervicalgia  Other low back pain  Abnormal posture  Cramp and spasm  Rationale for Evaluation and Treatment: Rehabilitation  ONSET DATE: 05/30/2023  SUBJECTIVE:                                                                                                                                                                                                         SUBJECTIVE STATEMENT: Patient reports pain is better this week. He just has some soreness in his legs today. He has not had any upper trap pain just tension in the base of his skull.  From Eval: Patient presents with neck pain at the base of skull that started 4-5 weeks ago. He feels he has been  tensing a lot from work and other stressors. He feels he has lost some ROM since the flare up. 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 He has been to [facility] in the past and he feels he got his back stronger and he felt better. He still does his exercises and if he is not consistent the pain gets worse.  He has had occasionally dull pains radiating down his arms. With his current job he has long work days and he has developed sciatic nerve pain on his left side. He attributes this to his bulging disk and piriformis muscle. He got a standing desk for work and that has helped his pain. He takes a break at work every 15 minutes. Currently sciatic nerve pain is worse than neck pain.  His current exercise program includes: prone on forearms; full cobra; bird dogs; trunk rotation strengthening; iso shoulder abduction + flexion; shoulder abduction; Y's at wall; shoulder row Hand dominance: Right  PERTINENT HISTORY:  Depression; anxiety; hx of cervical and lumbar pain  PAIN:  Are you having pain? Yes: NPRS scale: 3 (currently) 10(worst) Pain location: base of skull; left side lumbar ; bilateral upper trap Pain description: tight; dull; sore. Feels like someone poured lava down arms Aggravating factors: sitting for work;  Relieving factors: salonpas; tiger balm; laying down  PRECAUTIONS: None  RED FLAGS: None     WEIGHT BEARING RESTRICTIONS: No  FALLS:  Has patient fallen in last 6 months? 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  DIAGNOSTIC FINDINGS:  None  PATIENT SURVEYS:  NDI 22/50 44%  COGNITION: Overall cognitive status: Within functional limits for tasks assessed  SENSATION: Radiating pain down bilateral arms and   POSTURE: rounded shoulders and forward head  PALPATION: Increased muscle spasms bilateral upper trap & suboccipitals   CERVICAL ROM: WFL patient feels tightness end range cervical rotation    UPPER EXTREMITY ROM: WFL   UPPER EXTREMITY [id number] 4-4+/5    FUNCTIONAL TESTS: None tested at eval   TREATMENT DATE:  07/27/2023 UBE Level 2.0 3/3 6 mins - PT present to discuss status QL door way stretch 2 x 20 sec each Supine hamstring stretch with green strap 2 x 30 sec bilateral  ITB stretch with green strap 2 x 30 sec bilateral Childs Pose 3 x 20sec Prone shoulder Row & extension 3# x 10 bilateral  Prone shoulder Y & abduction 3# x10 bilateral   Trigger Point Dry Needling  Subsequent Treatment: Instructions provided previously at initial dry needling treatment.   Patient Verbal Consent Given: Yes Education Handout Provided: Previously Provided Muscles Treated: bilateral suboccipitals  Electrical Stimulation Performed: No Treatment Response/Outcome: Utilized skilled palpation to identify trigger points.  During dry needling able to palpate muscle twitch and muscle elongation     07/20/2023 UBE Level 2.0 3/3 6 mins - PT present to discuss status Childs Pose x 20sec Childs Pose + sidebend x 20sec bilateral  QL door way stretch 2 x 20 sec each Hamstring stretch with green strap 2 x 30 sec bilateral  ITB stretch with green strap 2 x 30 sec bilateral Supine piriformis stretch 2 x 30 sec bilateral  Melt Method x20 (flexion & rotation) Manual suboccipital release  Trigger Point Dry Needling  Subsequent Treatment: Instructions provided previously at initial dry needling treatment.   Patient Verbal  Consent Given: Yes Education Handout Provided: Previously Provided Muscles Treated: bilateral upper traps Electrical Stimulation Performed: No Treatment  Response/Outcome: Utilized skilled palpation to identify trigger points.  During dry needling able to palpate muscle twitch and muscle elongation      07/12/2023 UBE Level 1.5 3/3 6 mins - PT present to discuss status Hamstring stretch with green strap 2 x 30 sec bilateral  ITB stretch with green strap 2 x 30 sec bilateral 3 way stability ball stretch x 8 each direction Supine piriformis stretch 2 x 30 sec bilateral  Open Books x 8 each direction Cat Cow x 8 Melt Method x15 (flexion & rotation) 3 way stability ball stretch x 8 each direction  Trigger Point Dry Needling  Initial Treatment: Pt instructed on Dry Needling rational, procedures, and possible side effects. Pt instructed to expect mild to moderate muscle soreness later in the day and/or into the next day.  Pt instructed in methods to reduce muscle soreness. Pt instructed to continue prescribed HEP. Patient verbalized understanding of these instructions and education.   Patient Verbal Consent Given: Yes Education Handout Provided: Previously Provided Muscles Treated: bilateral upper traps Electrical Stimulation Performed: No Treatment Response/Outcome: Utilized skilled palpation to identify trigger points.  During dry needling able to palpate muscle twitch and muscle elongation   Manual STM performed after dry needling      PATIENT EDUCATION:  Education details: See above; POC; HEP Person educated: Patient Education method: Explanation, Demonstration, and Handouts Education comprehension: verbalized understanding, returned demonstration, and needs further education  HOME EXERCISE PROGRAM: Access Code: [email or web address] URL: [email or web address] Date: 07/20/2023 Prepared by: Penelope Bowie  Exercises - Supine Diaphragmatic Breathing  - 3 x daily - 7 x  weekly - 2 sets - 20-30 hold - Supine Hamstring Stretch with Strap  - 3 x daily - 7 x weekly - 2 sets - 20-30 hold - Supine ITB Stretch with Strap  - 3 x daily - 7 x weekly - 2 sets - 20-30 hold - Seated Piriformis Stretch with Trunk Bend  - 3 x daily - 7 x weekly - 2 sets - 20-30 hold - Supine Figure 4 Piriformis Stretch  - 3 x daily - 7 x weekly - 2 sets - 20-30 hold - Standing Quadratus Lumborum Stretch with Doorway  - 1 x daily - 7 x weekly - 2 sets - 20 hold - Child's Pose Stretch  - 1 x daily - 7 x weekly - 1-2 sets - 20 hold - Child's Pose with Sidebending  - 1 x daily - 7 x weekly - 1-2 sets - 20 hold  Proper standing alignment at desk  ASSESSMENT:  CLINICAL IMPRESSION: Terrion presents to therapy with no neck or back pain. He felt good after dry needling last session and verbalized less tension in his upper traps. His legs felt very sore today but he responded well to LE stretching and flexibility. Dry needling was successful at relieving tension in the based of the skull. Educated on stretches he can perform. Incorporated postural strengthening exercises and patient required verbal and visual cues for correct exercise performance. He should continue to progress well with physical therapy.      OBJECTIVE IMPAIRMENTS: decreased ROM, decreased strength, increased fascial restrictions, increased muscle spasms, impaired flexibility, improper body mechanics, postural dysfunction, and pain.   ACTIVITY LIMITATIONS: lifting, sitting, standing, sleeping, and reach over head  PARTICIPATION LIMITATIONS: interpersonal relationship, driving, community activity, and occupation  PERSONAL FACTORS: Profession, Time since onset of injury/illness/exacerbation, and 1-2 comorbidities: depression; anxiety are also affecting patient's functional outcome.   REHAB POTENTIAL: Good  CLINICAL DECISION MAKING:  Evolving/moderate complexity  EVALUATION COMPLEXITY: Moderate   GOALS: Goals reviewed with  patient? Yes  SHORT TERM GOALS: Target date: 08/01/2023  Patient will be independent with initial HEP. Baseline:  Goal status: INITIAL  2.  Patient will report > or = to 30% improvement in neck/ back  pain since starting PT. Baseline:  Goal status: INITIAL   3.  Patient will demonstrate correct seated and standing posture for proper cervical alignment. Baseline:  Goal status: INITIAL  LONG TERM GOALS: Target date: 08/29/2023  Patient will demonstrate independence in advanced HEP. Baseline:  Goal status: INITIAL  2.  Patient will report > or = to 70% improvement in neck/ back pain since starting PT. Baseline:  Goal status: INITIAL  3.  Patient will verbalize and demonstrate self-care strategies to manage pain including tissue mobility practices and change of position. Baseline:  Goal status: INITIAL   4.  Patient will be able to complete work day with < or = to 2/10 pain.  Baseline:  Goal status: INITIAL     PLAN:  PT FREQUENCY: 2x/week  PT DURATION: 8 weeks  PLANNED INTERVENTIONS: 97164- PT Re-evaluation, 97110-Therapeutic exercises, 97530- Therapeutic activity, 97112- Neuromuscular re-education, 97535- Self Care, 16109- Manual therapy, [phone]- Canalith repositioning, [id number]- Aquatic Therapy, [phone]- Electrical stimulation (unattended), [phone]- Electrical stimulation (manual), 97016- Vasopneumatic device, [id number]- Ultrasound, [id number]- Traction (mechanical), [id number]- Ionotophoresis 4mg /ml Dexamethasone, Balance training, Stair training, Taping, Dry Needling, Joint mobilization, Joint manipulation, Spinal manipulation, Spinal mobilization, Manual lymph drainage, Scar mobilization, Vestibular training, Cryotherapy, and Moist heat  PLAN FOR NEXT SESSION: Assess response to DN #3 and pain levels; add postural strengthening to HEP;  DN/manual as indicated    Penelope Bowie, PT 07/27/23 11:53 AM [facility] [address], [address] [place], [place]  [address] Phone # [phone] Fax [phone]

## 2023-08-01 ENCOUNTER — Telehealth: Payer: Self-pay | Admitting: Physical Therapy

## 2023-08-01 ENCOUNTER — Ambulatory Visit: Admitting: Physical Therapy

## 2023-08-01 NOTE — Telephone Encounter (Signed)
 Spoke with patient about missed appointment. He is under the weather and forgot to cancel his appointment. He confirmed appointment for Friday 5/16 10:15am.    Penelope Bowie, PT 08/01/23 12:59 PM

## 2023-08-03 ENCOUNTER — Ambulatory Visit: Admitting: Physical Therapy

## 2023-08-08 ENCOUNTER — Ambulatory Visit: Admitting: Physical Therapy

## 2023-08-08 ENCOUNTER — Encounter: Payer: Self-pay | Admitting: Physical Therapy

## 2023-08-08 DIAGNOSIS — R252 Cramp and spasm: Secondary | ICD-10-CM | POA: Diagnosis not present

## 2023-08-08 DIAGNOSIS — M5459 Other low back pain: Secondary | ICD-10-CM | POA: Diagnosis not present

## 2023-08-08 DIAGNOSIS — M542 Cervicalgia: Secondary | ICD-10-CM | POA: Diagnosis not present

## 2023-08-08 DIAGNOSIS — R293 Abnormal posture: Secondary | ICD-10-CM | POA: Diagnosis not present

## 2023-08-08 NOTE — Therapy (Signed)
 OUTPATIENT PHYSICAL THERAPY CERVICAL TREATMENT   Patient Name: Kevin Lane MRN: [id number] DOB:[birth year], 23 y.o., male Today's Date: 08/08/2023  END OF SESSION:  PT End of Session - 08/08/23 1154     Visit Number 5    Date for PT Re-Evaluation 08/29/23    Authorization Type [facility] [employer]    PT Start Time 1104    PT Stop Time 1143    PT Time Calculation (min) 39 min    Activity Tolerance Patient tolerated treatment well    Behavior During Therapy [facility] for tasks assessed/performed                 Past Medical History:  Diagnosis Date   Allergy    Depression with anxiety April 2015   History reviewed. No pertinent surgical history. 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   Irritation of right ulnar nerve 12/23/2018   Encounter for routine child health examination without abnormal findings 10/24/2016   BMI (body mass index), pediatric, 5% to less than 85% for age 04/26/2013   Depression with anxiety 06/24/2013   Inflammatory acne 06/24/2013    PCP: None  REFERRING PROVIDER: Salina Craver, MD  REFERRING DIAG: M54.2 (ICD-10-CM) - Neck pain  THERAPY DIAG:  Cervicalgia  Other low back pain  Abnormal posture  Cramp and spasm  Rationale for Evaluation and Treatment: Rehabilitation  ONSET DATE: 05/30/2023  SUBJECTIVE:                                                                                                                                                                                                         SUBJECTIVE STATEMENT: Patient presents with increased left sided back pain that is sharp. Pain is currently 6/10. He felt really good with dry needling last session he feels less tension.  From Eval: Patient presents with neck pain at the base of skull that started 4-5 weeks ago. He feels he has  been tensing a lot from work and other stressors. He feels he has lost some ROM since the flare up. 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 He has been to [facility] in the past and he feels he got his back stronger and he felt better. He still does his exercises and if he is not consistent the pain gets worse. He has had  occasionally dull pains radiating down his arms. With his current job he has long work days and he has developed sciatic nerve pain on his left side. He attributes this to his bulging disk and piriformis muscle. He got a standing desk for work and that has helped his pain. He takes a break at work every 15 minutes. Currently sciatic nerve pain is worse than neck pain.  His current exercise program includes: prone on forearms; full cobra; bird dogs; trunk rotation strengthening; iso shoulder abduction + flexion; shoulder abduction; Y's at wall; shoulder row Hand dominance: Right  PERTINENT HISTORY:  Depression; anxiety; hx of cervical and lumbar pain  PAIN:  Are you having pain? Yes: NPRS scale: 3 (currently) 10(worst) Pain location: base of skull; left side lumbar ; bilateral upper trap Pain description: tight; dull; sore. Feels like someone poured lava down arms Aggravating factors: sitting for work;  Relieving factors: salonpas; tiger balm; laying down  PRECAUTIONS: None  RED FLAGS: None     WEIGHT BEARING RESTRICTIONS: No  FALLS:  Has patient fallen in last 6 months? 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  DIAGNOSTIC FINDINGS:  None  PATIENT SURVEYS:  NDI 22/50 44%  COGNITION: Overall cognitive status: Within functional limits for tasks assessed  SENSATION: Radiating pain down bilateral arms and   POSTURE: rounded shoulders and forward head  PALPATION: Increased muscle spasms bilateral upper trap & suboccipitals   CERVICAL ROM: WFL patient feels tightness end range cervical rotation    UPPER EXTREMITY ROM: WFL   UPPER EXTREMITY [id number] 4-4+/5    FUNCTIONAL TESTS: None tested at eval   TREATMENT DATE:  08/08/2023 UBE Level 2.0 3/3 6 mins - PT present to discuss status Supine hamstring stretch with green strap 2 x 30 sec bilateral  ITB stretch with green strap 2 x 30 sec bilateral Supine figure four stretch 2 x 30 sec bilateral   Trigger Point Dry Needling  Subsequent Treatment: Instructions provided previously at initial dry needling treatment.   Patient Verbal Consent Given: Yes Education Handout Provided: Previously Provided Muscles Treated: Left QL Electrical Stimulation Performed: No Treatment Response/Outcome: Utilized skilled palpation to identify trigger points.  During dry needling able to palpate muscle twitch and muscle elongation   QL doorways stretch 2 x 30 sec bilateral       07/27/2023 UBE Level 2.0 3/3 6 mins - PT present to discuss status QL door way stretch 2 x 20 sec each Supine hamstring stretch with green strap 2 x 30 sec bilateral  ITB stretch with green strap 2 x 30 sec bilateral Childs Pose 3 x 20sec Prone shoulder Row & extension 3# x 10 bilateral  Prone shoulder Y & abduction 3# x10 bilateral   Trigger Point Dry Needling  Subsequent Treatment: Instructions provided previously at initial dry needling treatment.   Patient Verbal Consent Given: Yes Education Handout Provided: Previously Provided Muscles Treated: bilateral suboccipitals  Electrical  Stimulation Performed: No Treatment Response/Outcome: Utilized skilled palpation to identify trigger points.  During dry needling able to palpate muscle twitch and muscle elongation  07/20/2023 UBE Level 2.0 3/3 6 mins - PT present to discuss status Childs Pose x 20sec Childs Pose + sidebend x 20sec bilateral  QL door way stretch 2 x 20 sec each Hamstring stretch with green strap 2 x 30 sec bilateral  ITB stretch with green strap 2 x 30 sec bilateral Supine piriformis stretch 2 x 30 sec bilateral  Melt Method x20 (flexion & rotation) Manual suboccipital release  Trigger Point Dry Needling  Subsequent Treatment: Instructions provided previously at initial dry needling treatment.   Patient Verbal Consent Given: Yes Education Handout Provided: Previously Provided Muscles Treated: bilateral upper traps Electrical Stimulation Performed: No Treatment Response/Outcome: Utilized skilled palpation to identify trigger points.  During dry needling able to palpate muscle twitch and muscle elongation       PATIENT EDUCATION:  Education details: See above; POC; HEP Person educated: Patient Education method: Explanation, Demonstration, and Handouts Education comprehension: verbalized understanding, returned demonstration, and needs further education  HOME EXERCISE PROGRAM: Access Code: [email or web address] URL: [email or web address] Date: 07/20/2023 Prepared by: Penelope Bowie  Exercises - Supine Diaphragmatic Breathing  - 3 x daily - 7 x weekly - 2 sets - 20-30 hold - Supine Hamstring Stretch with Strap  - 3 x daily - 7 x weekly - 2 sets - 20-30 hold - Supine ITB Stretch with Strap  - 3 x daily - 7 x weekly - 2 sets - 20-30 hold - Seated Piriformis Stretch with Trunk Bend  - 3 x daily - 7 x weekly - 2 sets - 20-30 hold - Supine Figure 4 Piriformis Stretch  - 3 x daily - 7 x weekly - 2 sets - 20-30 hold - Standing Quadratus Lumborum Stretch with Doorway  - 1 x daily - 7 x weekly - 2  sets - 20 hold - Child's Pose Stretch  - 1 x daily - 7 x weekly - 1-2 sets - 20 hold - Child's Pose with Sidebending  - 1 x daily - 7 x weekly - 1-2 sets - 20 hold  Proper standing alignment at desk  ASSESSMENT:  CLINICAL IMPRESSION: Kevin Lane presents to therapy with increased left sided back pain. He has been sick the past week and he has not been able to do his exercises like normal. He verbalized feeling 60% better since starting therapy. He feels he needs to be more consist with his HEP. Dry needling was successful at elongating and relieving muscle tension in quadratus lumborum. Educated patient on stretches to perform at home to maintain flexibility. Patient will benefit from skilled PT to address the below impairments and improve overall function.    OBJECTIVE IMPAIRMENTS: decreased ROM, decreased strength, increased fascial restrictions, increased muscle spasms, impaired flexibility, improper body mechanics, postural dysfunction, and pain.   ACTIVITY LIMITATIONS: lifting, sitting, standing, sleeping, and reach over head  PARTICIPATION LIMITATIONS: interpersonal relationship, driving, community activity, and occupation  PERSONAL FACTORS: Profession, Time since onset of injury/illness/exacerbation, and 1-2 comorbidities: depression; anxiety are also affecting patient's functional outcome.   REHAB POTENTIAL: Good  CLINICAL DECISION MAKING: Evolving/moderate complexity  EVALUATION COMPLEXITY: Moderate   GOALS: Goals reviewed with patient? Yes  SHORT TERM GOALS: Target date: 08/01/2023  Patient will be independent with initial HEP. Baseline:  Goal status: MET 08/08/2023  2.  Patient will report > or = to 30% improvement in neck/ back  pain since starting PT. Baseline:  Goal status:MET 08/08/2023   3.  Patient will demonstrate correct seated and standing posture for proper cervical alignment. Baseline:  Goal  status: INITIAL  LONG TERM GOALS: Target date:  08/29/2023  Patient will demonstrate independence in advanced HEP. Baseline:  Goal status: INITIAL  2.  Patient will report > or = to 70% improvement in neck/ back pain since starting PT. Baseline:  Goal status: INITIAL  3.  Patient will verbalize and demonstrate self-care strategies to manage pain including tissue mobility practices and change of position. Baseline:  Goal status: INITIAL   4.  Patient will be able to complete work day with < or = to 2/10 pain.  Baseline:  Goal status: INITIAL     PLAN:  PT FREQUENCY: 2x/week  PT DURATION: 8 weeks  PLANNED INTERVENTIONS: 97164- PT Re-evaluation, 97110-Therapeutic exercises, 97530- Therapeutic activity, 97112- Neuromuscular re-education, 97535- Self Care, 44010- Manual therapy, [phone]- Canalith repositioning, [id number]- Aquatic Therapy, [phone]- Electrical stimulation (unattended), [phone]- Electrical stimulation (manual), 97016- Vasopneumatic device, [id number]- Ultrasound, [id number]- Traction (mechanical), [id number]- Ionotophoresis 4mg /ml Dexamethasone, Balance training, Stair training, Taping, Dry Needling, Joint mobilization, Joint manipulation, Spinal manipulation, Spinal mobilization, Manual lymph drainage, Scar mobilization, Vestibular training, Cryotherapy, and Moist heat  PLAN FOR NEXT SESSION: Assess response to DN #4 and pain levels;  postural strengthening ;  DN/manual as indicated    Penelope Bowie, PT 08/08/23 11:55 AM [facility] [address] [place], [place] [address] Phone # [phone] Fax [phone]

## 2023-08-15 ENCOUNTER — Ambulatory Visit: Admitting: Physical Therapy

## 2023-08-17 ENCOUNTER — Ambulatory Visit: Admitting: Physical Therapy

## 2023-08-17 ENCOUNTER — Encounter: Payer: Self-pay | Admitting: Physical Therapy

## 2023-08-17 DIAGNOSIS — M5459 Other low back pain: Secondary | ICD-10-CM

## 2023-08-17 DIAGNOSIS — R293 Abnormal posture: Secondary | ICD-10-CM | POA: Diagnosis not present

## 2023-08-17 DIAGNOSIS — M542 Cervicalgia: Secondary | ICD-10-CM

## 2023-08-17 DIAGNOSIS — R252 Cramp and spasm: Secondary | ICD-10-CM | POA: Diagnosis not present

## 2023-08-17 NOTE — Therapy (Signed)
 OUTPATIENT PHYSICAL THERAPY CERVICAL TREATMENT   Patient Name: Kevin Lane MRN: [id number] DOB:[birth year], 23 y.o., male Today's Date: 08/17/2023  END OF SESSION:  PT End of Session - 08/17/23 1158     Visit Number 6    Date for PT Re-Evaluation 08/29/23    Authorization Type [facility] [employer]    PT Start Time 1102    PT Stop Time 1151    PT Time Calculation (min) 49 min    Activity Tolerance Patient tolerated treatment well    Behavior During Therapy [facility] for tasks assessed/performed                  Past Medical History:  Diagnosis Date   Allergy    Depression with anxiety April 2015   History reviewed. No pertinent surgical history. 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   Irritation of right ulnar nerve 12/23/2018   Encounter for routine child health examination without abnormal findings 10/24/2016   BMI (body mass index), pediatric, 5% to less than 85% for age 29/09/2013   Depression with anxiety 06/24/2013   Inflammatory acne 06/24/2013    PCP: None  REFERRING PROVIDER: Salina Craver, MD  REFERRING DIAG: M54.2 (ICD-10-CM) - Neck pain  THERAPY DIAG:  Cervicalgia  Other low back pain  Abnormal posture  Cramp and spasm  Rationale for Evaluation and Treatment: Rehabilitation  ONSET DATE: 05/30/2023  SUBJECTIVE:                                                                                                                                                                                                         SUBJECTIVE STATEMENT: Patient reports he was very tense in his neck over the past few days. He has not had any back pain and he feels dry needling really helped last session.    From Eval: Patient presents with neck pain at the base of skull that started 4-5 weeks ago. He feels he has been  tensing a lot from work and other stressors. He feels he has lost some ROM since the flare up. 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 He has been to [facility] in the past and he feels he got his back stronger and he felt better. He still does his exercises and if he is not consistent the pain  gets worse. He has had occasionally dull pains radiating down his arms. With his current job he has long work days and he has developed sciatic nerve pain on his left side. He attributes this to his bulging disk and piriformis muscle. He got a standing desk for work and that has helped his pain. He takes a break at work every 15 minutes. Currently sciatic nerve pain is worse than neck pain.  His current exercise program includes: prone on forearms; full cobra; bird dogs; trunk rotation strengthening; iso shoulder abduction + flexion; shoulder abduction; Y's at wall; shoulder row Hand dominance: Right  PERTINENT HISTORY:  Depression; anxiety; hx of cervical and lumbar pain  PAIN:  Are you having pain? Yes: NPRS scale: 3 (currently) 10(worst) Pain location: base of skull; left side lumbar ; bilateral upper trap Pain description: tight; dull; sore. Feels like someone poured lava down arms Aggravating factors: sitting for work;  Relieving factors: salonpas; tiger balm; laying down  PRECAUTIONS: None  RED FLAGS: None     WEIGHT BEARING RESTRICTIONS: No  FALLS:  Has patient fallen in last 6 months? 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  DIAGNOSTIC FINDINGS:  None  PATIENT SURVEYS:  NDI 22/50 44%  COGNITION: Overall cognitive status: Within functional limits for tasks assessed  SENSATION: Radiating pain down bilateral arms and   POSTURE: rounded shoulders and forward head  PALPATION: Increased muscle spasms bilateral upper trap & suboccipitals   CERVICAL ROM: WFL patient feels tightness end range cervical rotation    UPPER EXTREMITY ROM: WFL   UPPER EXTREMITY ZOX:WRUEAVW 4-4+/5    FUNCTIONAL TESTS: None tested at eval   TREATMENT DATE:  08/17/2023 UBE Level 2.0 3/3 6 mins - PT present to discuss status QL doorways stretch 2 x 30 sec bilateral  Supine hamstring stretch with green strap 2 x 30 sec bilateral  Supine figure four stretch 2 x 30 sec bilateral  Alt knee hand and knee press with red stability ball in between x 10 90/90 toe tap x 20 Prone shoulder Y & abduction 3# 2x10 bilateral   Trigger Point Dry Needling  Subsequent Treatment: Instructions provided previously at initial dry needling treatment.  Patient Verbal Consent Given: Yes Education Handout Provided: Previously Provided Muscles Treated: bilateral suboccipitals; Left QL  Electrical Stimulation Performed: No Treatment Response/Outcome: Utilized skilled palpation to identify trigger points.  During dry needling able to palpate muscle twitch and muscle elongation      08/08/2023 UBE Level 2.0 3/3 6 mins - PT present to discuss status Supine hamstring stretch with green strap 2 x 30 sec bilateral  ITB stretch with green strap 2 x 30 sec bilateral Supine figure four stretch 2 x 30 sec bilateral   Trigger Point Dry Needling  Subsequent Treatment: Instructions provided previously at initial dry needling treatment.   Patient Verbal Consent Given: Yes Education Handout Provided: Previously Provided Muscles Treated: Left QL Electrical Stimulation Performed:  No Treatment Response/Outcome: Utilized skilled palpation to identify trigger points.  During dry needling able to palpate muscle twitch and muscle elongation  QL doorways stretch 2 x 30 sec bilateral      07/27/2023 UBE Level 2.0 3/3 6 mins - PT present to discuss status QL door way stretch 2 x 20 sec each Supine hamstring stretch with green strap 2 x 30 sec bilateral  ITB stretch with green strap 2 x 30 sec bilateral Childs Pose 3 x 20sec Prone shoulder Row & extension 3# x 10 bilateral  Prone shoulder Y & abduction 3# x10 bilateral   Trigger Point Dry Needling  Subsequent Treatment: Instructions provided previously at initial dry needling treatment.   Patient Verbal Consent Given: Yes Education Handout Provided: Previously Provided Muscles Treated: bilateral suboccipitals  Electrical Stimulation Performed: No Treatment Response/Outcome: Utilized skilled palpation to identify trigger points.  During dry needling able to palpate muscle twitch and muscle elongation    PATIENT EDUCATION:  Education details: See above; POC; HEP Person educated: Patient Education method: Explanation, Demonstration, and Handouts Education comprehension: verbalized understanding, returned demonstration, and needs further education  HOME EXERCISE PROGRAM: Access Code: [email or web address] URL: [email or web address] Date: 08/17/2023 Prepared by: Penelope Bowie  Exercises - Supine Diaphragmatic Breathing  - 3 x daily - 7 x weekly - 2 sets - 20-30 hold - Supine Hamstring Stretch with Strap  - 3 x daily - 7 x weekly - 2 sets - 20-30 hold - Supine ITB Stretch with Strap  - 3 x daily - 7 x weekly - 2 sets - 20-30 hold - Seated Piriformis Stretch with Trunk Bend  - 3 x daily - 7 x weekly - 2 sets - 20-30 hold - Supine Figure 4 Piriformis Stretch  - 3 x daily - 7 x weekly - 2 sets - 20-30 hold - Standing Quadratus Lumborum Stretch with Doorway  - 1 x daily - 7 x weekly - 2 sets - 20 hold - Child's Pose  Stretch  - 1 x daily - 7 x weekly - 1-2 sets - 20 hold - Child's Pose with Sidebending  - 1 x daily - 7 x weekly - 1-2 sets - 20 hold - Supine Alternating Knee Taps with Hands  - 1 x daily - 7 x weekly - 1 sets - 10 reps - 3 hold - Supine 90/90 Alternating Toe Touch  - 1 x daily - 7 x weekly - 1 sets - 20 reps Proper standing alignment at desk  ASSESSMENT:  CLINICAL IMPRESSION: Maxon present to therapy with complaints of tightness in his neck. He verbalized relief after dry needling last session. Incorporated core and postural strengthening today. Patient required verbal cues for appropriate TA activation. Updated HEP to include these exercises. Overall, patient tolerated treatment session well. Dry needling was successful at eliciting muscle twitch and elongation. Patient will benefit from skilled PT to address the below impairments and improve overall function.     OBJECTIVE IMPAIRMENTS: decreased ROM, decreased strength, increased fascial restrictions, increased muscle spasms, impaired flexibility, improper body mechanics, postural dysfunction, and pain.   ACTIVITY LIMITATIONS: lifting, sitting, standing, sleeping, and reach over head  PARTICIPATION LIMITATIONS: interpersonal relationship, driving, community activity, and occupation  PERSONAL FACTORS: Profession, Time since onset of injury/illness/exacerbation, and 1-2 comorbidities: depression; anxiety are also affecting patient's functional outcome.   REHAB POTENTIAL: Good  CLINICAL DECISION MAKING: Evolving/moderate complexity  EVALUATION COMPLEXITY: Moderate   GOALS: Goals reviewed with patient? Yes  SHORT TERM GOALS: Target date: 08/01/2023  Patient will be independent with initial HEP. Baseline:  Goal status: MET 08/08/2023  2.  Patient will report > or = to  30% improvement in neck/ back  pain since starting PT. Baseline:  Goal status:MET 08/08/2023   3.  Patient will demonstrate correct seated and standing posture  for proper cervical alignment. Baseline:  Goal status: INITIAL  LONG TERM GOALS: Target date: 08/29/2023  Patient will demonstrate independence in advanced HEP. Baseline:  Goal status: INITIAL  2.  Patient will report > or = to 70% improvement in neck/ back pain since starting PT. Baseline:  Goal status: INITIAL  3.  Patient will verbalize and demonstrate self-care strategies to manage pain including tissue mobility practices and change of position. Baseline:  Goal status: INITIAL   4.  Patient will be able to complete work day with < or = to 2/10 pain.  Baseline:  Goal status: INITIAL     PLAN:  PT FREQUENCY: 2x/week  PT DURATION: 8 weeks  PLANNED INTERVENTIONS: 97164- PT Re-evaluation, 97110-Therapeutic exercises, 97530- Therapeutic activity, 97112- Neuromuscular re-education, 97535- Self Care, 95621- Manual therapy, [phone]- Canalith repositioning, [id number]- Aquatic Therapy, [phone]- Electrical stimulation (unattended), [phone]- Electrical stimulation (manual), 97016- Vasopneumatic device, [id number]- Ultrasound, [id number]- Traction (mechanical), [id number]- Ionotophoresis 4mg /ml Dexamethasone, Balance training, Stair training, Taping, Dry Needling, Joint mobilization, Joint manipulation, Spinal manipulation, Spinal mobilization, Manual lymph drainage, Scar mobilization, Vestibular training, Cryotherapy, and Moist heat  PLAN FOR NEXT SESSION: Assess response to DN #5 ;  core & postural strengthening ;  DN/manual as indicated    Penelope Bowie, PT 08/17/23 11:59 AM [facility] [address] [place], [place] [address] Phone # [phone] Fax [phone]

## 2023-08-22 ENCOUNTER — Encounter: Admitting: Physical Therapy

## 2023-08-24 ENCOUNTER — Ambulatory Visit: Attending: Family Medicine | Admitting: Physical Therapy

## 2023-08-24 ENCOUNTER — Encounter: Payer: Self-pay | Admitting: Physical Therapy

## 2023-08-24 DIAGNOSIS — M545 Low back pain, unspecified: Secondary | ICD-10-CM | POA: Insufficient documentation

## 2023-08-24 DIAGNOSIS — M542 Cervicalgia: Secondary | ICD-10-CM | POA: Insufficient documentation

## 2023-08-24 DIAGNOSIS — R293 Abnormal posture: Secondary | ICD-10-CM | POA: Insufficient documentation

## 2023-08-24 DIAGNOSIS — M5459 Other low back pain: Secondary | ICD-10-CM | POA: Insufficient documentation

## 2023-08-24 DIAGNOSIS — R252 Cramp and spasm: Secondary | ICD-10-CM | POA: Insufficient documentation

## 2023-08-24 DIAGNOSIS — G8929 Other chronic pain: Secondary | ICD-10-CM | POA: Insufficient documentation

## 2023-08-24 NOTE — Therapy (Signed)
 OUTPATIENT PHYSICAL THERAPY CERVICAL TREATMENT    Patient Name: Kevin Lane MRN: [id number] DOB:June 02, 2000, 23 y.o., male Today's Date: 08/24/2023  END OF SESSION:  PT End of Session - 08/24/23 1153     Visit Number 7    Date for PT Re-Evaluation 08/29/23    Authorization Type [facility] [employer]    PT Start Time 1103    PT Stop Time 1148    PT Time Calculation (min) 45 min    Activity Tolerance Patient tolerated treatment well    Behavior During Therapy [facility] for tasks assessed/performed                   Past Medical History:  Diagnosis Date   Allergy    Depression with anxiety April 2015   History reviewed. No pertinent surgical history. 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   Irritation of right ulnar nerve 12/23/2018   Encounter for routine child health examination without abnormal findings 10/24/2016   BMI (body mass index), pediatric, 5% to less than 85% for age 81/09/2013   Depression with anxiety 06/24/2013   Inflammatory acne 06/24/2013    PCP: None  REFERRING PROVIDER: Salina Craver, MD  REFERRING DIAG: M54.2 (ICD-10-CM) - Neck pain  THERAPY DIAG:  Cervicalgia  Other low back pain  Abnormal posture  Cramp and spasm  Rationale for Evaluation and Treatment: Rehabilitation  ONSET DATE: 05/30/2023  SUBJECTIVE:                                                                                                                                                                                                         SUBJECTIVE STATEMENT: Patient presents to therapy with no pain. He felt good after last dry needling session. He was very sore.   From Eval: Patient presents with neck pain at the base of skull that started 4-5 weeks ago. He feels he has been tensing a lot from work and other stressors. He  feels he has lost some ROM since the flare up. 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 He has been to [facility] in the past and he feels he got his back stronger and he felt better. He still does his exercises and if he is not consistent the pain gets worse. He has had occasionally dull pains radiating down  his arms. With his current job he has long work days and he has developed sciatic nerve pain on his left side. He attributes this to his bulging disk and piriformis muscle. He got a standing desk for work and that has helped his pain. He takes a break at work every 15 minutes. Currently sciatic nerve pain is worse than neck pain.  His current exercise program includes: prone on forearms; full cobra; bird dogs; trunk rotation strengthening; iso shoulder abduction + flexion; shoulder abduction; Y's at wall; shoulder row Hand dominance: Right  PERTINENT HISTORY:  Depression; anxiety; hx of cervical and lumbar pain  PAIN:  Are you having pain? Yes: NPRS scale: 3 (currently) 10(worst) Pain location: base of skull; left side lumbar ; bilateral upper trap Pain description: tight; dull; sore. Feels like someone poured lava down arms Aggravating factors: sitting for work;  Relieving factors: salonpas; tiger balm; laying down  PRECAUTIONS: None  RED FLAGS: None     WEIGHT BEARING RESTRICTIONS: No  FALLS:  Has patient fallen in last 6 months? 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  DIAGNOSTIC FINDINGS:  None  PATIENT SURVEYS:  NDI 22/50 44%  COGNITION: Overall cognitive status: Within functional limits for tasks assessed  SENSATION: Radiating pain down bilateral arms and   POSTURE: rounded shoulders and forward head  PALPATION: Increased muscle spasms bilateral upper trap & suboccipitals   CERVICAL ROM: WFL patient feels tightness end range cervical rotation    UPPER EXTREMITY ROM: WFL   UPPER EXTREMITY BJY:NWGNFAO 4-4+/5    FUNCTIONAL TESTS: None tested at eval   TREATMENT DATE:  08/24/2023 UBE Level 2.0 3/3 6 mins - PT present to discuss status Updated of HEP exercises for periscapular strengthening (see below) Seated bent over Y & T 3# DB 2 x 10 bilateral  3 way scap stabilization with red TB x 8 bilateral  Plank off plinth + hip flexion x 10 90/90 toe tap x 20 Side Planks 2 x 15 sec bilateral  Dead Bug x 20 Supine Chin Tuck x 10 Cervical Rotation SNAG x 8 each direction Review of foam roll      08/17/2023 UBE Level 2.0 3/3 6 mins - PT present to discuss status QL doorways stretch 2 x 30 sec bilateral  Supine hamstring stretch with green strap 2 x 30 sec bilateral  Supine figure four stretch 2 x 30 sec bilateral  Alt knee hand and knee press with red stability ball in between x 10 90/90 toe tap x 20 Prone shoulder Y & abduction 3# 2x10 bilateral   Trigger Point Dry Needling  Subsequent Treatment: Instructions provided previously at initial dry needling treatment.  Patient Verbal Consent Given: Yes Education Handout Provided: Previously Provided Muscles Treated: bilateral suboccipitals; Left QL  Electrical Stimulation Performed: No Treatment Response/Outcome: Utilized skilled palpation to identify trigger points.  During dry needling able to palpate muscle twitch and muscle elongation      08/08/2023 UBE Level 2.0 3/3 6 mins - PT present to discuss status Supine hamstring stretch with green strap 2  x  30 sec bilateral  ITB stretch with green strap 2 x 30 sec bilateral Supine figure four stretch 2 x 30 sec bilateral   Trigger Point Dry Needling  Subsequent Treatment: Instructions provided previously at initial dry needling treatment.   Patient Verbal Consent Given: Yes Education Handout Provided: Previously Provided Muscles Treated: Left QL Electrical Stimulation Performed: No Treatment Response/Outcome: Utilized skilled palpation to identify trigger points.  During dry needling able to palpate muscle twitch and muscle elongation   QL doorways stretch 2 x 30 sec bilateral     PATIENT EDUCATION:  Education details: See above; POC; HEP Person educated: Patient Education method: Explanation, Demonstration, and Handouts Education comprehension: verbalized understanding, returned demonstration, and needs further education  HOME EXERCISE PROGRAM: Access Code: [email or web address] URL: [email or web address] Date: 08/24/2023 Prepared by: Penelope Bowie  Exercises - Supine Diaphragmatic Breathing  - 3 x daily - 7 x weekly - 2 sets - 20-30 hold - Supine Hamstring Stretch with Strap  - 3 x daily - 7 x weekly - 2 sets - 20-30 hold - Supine ITB Stretch with Strap  - 3 x daily - 7 x weekly - 2 sets - 20-30 hold - Seated Piriformis Stretch with Trunk Bend  - 3 x daily - 7 x weekly - 2 sets - 20-30 hold - Supine Figure 4 Piriformis Stretch  - 3 x daily - 7 x weekly - 2 sets - 20-30 hold - Standing Quadratus Lumborum Stretch with Doorway  - 1 x daily - 7 x weekly - 2 sets - 20 hold - Child's Pose Stretch  - 1 x daily - 7 x weekly - 1-2 sets - 20 hold - Child's Pose with Sidebending  - 1 x daily - 7 x weekly - 1-2 sets - 20 hold - Supine Alternating Knee Taps with Hands  - 1 x daily - 7 x weekly - 1 sets - 10 reps - 3 hold - Standing Plank on Wall with Reaches and Resistance  - 1 x daily - 7 x weekly - 1 sets - 8 reps - Standing Forward-Bent Shoulder Y With Dumbbells  - 1 x daily - 7 x  weekly - 2 sets - 10 reps - Standing Forward-Bent Horizontal Abduction With Dumbbells  - 1 x daily - 7 x weekly - 2 sets - 10 reps - [employer] on Counter  - 1 x daily - 7 x weekly - 1-2 sets - 10 reps - Side Plank on Knees  - 1 x daily - 7 x weekly - 1-2 sets - 15 sec hold - Supine 90/90 Alternating Toe Touch  - 1 x daily - 7 x weekly - 2 sets - 10 reps - Supine Dead Bug with Leg Extension  - 1 x daily - 7 x weekly - 3 sets - 10 reps - Supine Cervical Retraction with Towel  - 1 x daily - 7 x weekly - 1-2 sets - 10 reps - Seated Cervical Retraction  - 1 x daily - 7 x weekly - 1-2 sets - 10 reps - Seated Assisted Cervical Rotation with Towel  - 1 x daily - 7 x weekly - 1 sets - 10 reps Proper standing alignment at desk  ASSESSMENT:  CLINICAL IMPRESSION: Montray present to therapy with no back or neck pain. He felt dry needling last session was successful at addressing hip pain. Today's session focused on reviewing HEP and updating it to progress postural and core strengthening exercises. PT provided verbal cues and modifications as  needed. Patient is progressing appropriating with therapy. Plan to discharge next treatment session.    OBJECTIVE IMPAIRMENTS: decreased ROM, decreased strength, increased fascial restrictions, increased muscle spasms, impaired flexibility, improper body mechanics, postural dysfunction, and pain.   ACTIVITY LIMITATIONS: lifting, sitting, standing, sleeping, and reach over head  PARTICIPATION LIMITATIONS: interpersonal relationship, driving, community activity, and occupation  PERSONAL FACTORS: Profession, Time since onset of injury/illness/exacerbation, and 1-2 comorbidities: depression; anxiety are also affecting patient's functional outcome.   REHAB POTENTIAL: Good  CLINICAL DECISION MAKING: Evolving/moderate complexity  EVALUATION COMPLEXITY: Moderate   GOALS: Goals reviewed with patient? Yes  SHORT TERM GOALS: Target date: 08/01/2023  Patient  will be independent with initial HEP. Baseline:  Goal status: MET 08/08/2023  2.  Patient will report > or = to 30% improvement in neck/ back  pain since starting PT. Baseline:  Goal status:MET 08/08/2023   3.  Patient will demonstrate correct seated and standing posture for proper cervical alignment. Baseline:  Goal status: INITIAL  LONG TERM GOALS: Target date: 08/29/2023  Patient will demonstrate independence in advanced HEP. Baseline:  Goal status: INITIAL  2.  Patient will report > or = to 70% improvement in neck/ back pain since starting PT. Baseline:  Goal status: INITIAL  3.  Patient will verbalize and demonstrate self-care strategies to manage pain including tissue mobility practices and change of position. Baseline:  Goal status: INITIAL   4.  Patient will be able to complete work day with < or = to 2/10 pain.  Baseline:  Goal status: INITIAL     PLAN:  PT FREQUENCY: 2x/week  PT DURATION: 8 weeks  PLANNED INTERVENTIONS: 97164- PT Re-evaluation, 97110-Therapeutic exercises, 97530- Therapeutic activity, 97112- Neuromuscular re-education, 97535- Self Care, 16109- Manual therapy, [phone]- Canalith repositioning, [id number]- Aquatic Therapy, [phone]- Electrical stimulation (unattended), [phone]- Electrical stimulation (manual), 97016- Vasopneumatic device, [id number]- Ultrasound, [id number]- Traction (mechanical), [id number]- Ionotophoresis 4mg /ml Dexamethasone, Balance training, Stair training, Taping, Dry Needling, Joint mobilization, Joint manipulation, Spinal manipulation, Spinal mobilization, Manual lymph drainage, Scar mobilization, Vestibular training, Cryotherapy, and Moist heat  PLAN FOR NEXT SESSION: recert/ discharge next session  Penelope Bowie, PT 08/24/23 11:54 AM [facility] [address], [address] [place], [place] [address] Phone # [phone] Fax [phone]

## 2023-08-28 DIAGNOSIS — L709 Acne, unspecified: Secondary | ICD-10-CM | POA: Diagnosis not present

## 2023-08-28 DIAGNOSIS — M542 Cervicalgia: Secondary | ICD-10-CM | POA: Diagnosis not present

## 2023-08-28 DIAGNOSIS — M419 Scoliosis, unspecified: Secondary | ICD-10-CM | POA: Diagnosis not present

## 2023-08-28 DIAGNOSIS — F419 Anxiety disorder, unspecified: Secondary | ICD-10-CM | POA: Diagnosis not present

## 2023-08-28 DIAGNOSIS — G5620 Lesion of ulnar nerve, unspecified upper limb: Secondary | ICD-10-CM | POA: Diagnosis not present

## 2023-08-29 ENCOUNTER — Encounter: Admitting: Physical Therapy

## 2023-08-31 ENCOUNTER — Encounter: Payer: Self-pay | Admitting: Physical Therapy

## 2023-08-31 ENCOUNTER — Ambulatory Visit: Admitting: Physical Therapy

## 2023-08-31 DIAGNOSIS — R293 Abnormal posture: Secondary | ICD-10-CM

## 2023-08-31 DIAGNOSIS — M542 Cervicalgia: Secondary | ICD-10-CM

## 2023-08-31 DIAGNOSIS — R252 Cramp and spasm: Secondary | ICD-10-CM

## 2023-08-31 DIAGNOSIS — M5459 Other low back pain: Secondary | ICD-10-CM

## 2023-08-31 DIAGNOSIS — G8929 Other chronic pain: Secondary | ICD-10-CM | POA: Diagnosis not present

## 2023-08-31 DIAGNOSIS — M545 Low back pain, unspecified: Secondary | ICD-10-CM | POA: Diagnosis not present

## 2023-08-31 NOTE — Therapy (Signed)
 OUTPATIENT PHYSICAL THERAPY CERVICAL TREATMENT / RECERTIFICATION   Patient Name: Kevin Lane MRN: [id number] DOB:11/08/2000, 23 y.o., male Today's Date: 08/31/2023  END OF SESSION:  PT End of Session - 08/31/23 1204     Visit Number 8    Date for PT Re-Evaluation 10/12/23    Authorization Type [facility] [employer]    PT Start Time 1100    PT Stop Time 1152    PT Time Calculation (min) 52 min    Activity Tolerance Patient tolerated treatment well    Behavior During Therapy [facility] for tasks assessed/performed                 Past Medical History:  Diagnosis Date   Allergy    Depression with anxiety April 2015   History reviewed. No pertinent surgical history. 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   Irritation of right ulnar nerve 12/23/2018   Encounter for routine child health examination without abnormal findings 10/24/2016   BMI (body mass index), pediatric, 5% to less than 85% for age 37/09/2013   Depression with anxiety 06/24/2013   Inflammatory acne 06/24/2013    PCP: None  REFERRING PROVIDER: Salina Craver, MD  REFERRING DIAG: M54.2 (ICD-10-CM) - Neck pain  THERAPY DIAG:  Cervicalgia  Other low back pain  Abnormal posture  Cramp and spasm  Chronic bilateral low back pain without sciatica  Rationale for Evaluation and Treatment: Rehabilitation  ONSET DATE: 05/30/2023  SUBJECTIVE:                                                                                                                                                                                                         SUBJECTIVE STATEMENT: Patient reports he had a rough week this week. He has not be as compliant with HEP.   From Eval: Patient presents with neck pain at the base of skull that started 4-5 weeks ago. He feels he has been  tensing a lot from work and other stressors. He feels he has lost some ROM since the flare up. 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 He has been to BSR in the past and he feels he got his back stronger and he felt better. He still does his exercises and if he is not consistent the pain gets worse. He has had  occasionally dull pains radiating down his arms. With his current job he has long work days and he has developed sciatic nerve pain on his left side. He attributes this to his bulging disk and piriformis muscle. He got a standing desk for work and that has helped his pain. He takes a break at work every 15 minutes. Currently sciatic nerve pain is worse than neck pain.  His current exercise program includes: prone on forearms; full cobra; bird dogs; trunk rotation strengthening; iso shoulder abduction + flexion; shoulder abduction; Y's at wall; shoulder row Hand dominance: Right  PERTINENT HISTORY:  Depression; anxiety; hx of cervical and lumbar pain  PAIN:  Are you having pain? Yes: NPRS scale: 3 (currently) 10(worst) Pain location: base of skull; left side lumbar ; bilateral upper trap Pain description: tight; dull; sore. Feels like someone poured lava down arms Aggravating factors: sitting for work;  Relieving factors: salonpas; tiger balm; laying down  PRECAUTIONS: None  RED FLAGS: None     WEIGHT BEARING RESTRICTIONS: No  FALLS:  Has patient fallen in last 6 months? 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  DIAGNOSTIC FINDINGS:  None  PATIENT SURVEYS:  NDI 22/50 44%    08/31/2023 NDI 9/50 18%    COGNITION: Overall cognitive status: Within functional limits for tasks assessed  SENSATION: Radiating pain down bilateral arms and   POSTURE: rounded shoulders and forward head  PALPATION: Increased muscle spasms bilateral upper trap & suboccipitals   CERVICAL ROM: WFL patient feels tightness end range cervical rotation    UPPER EXTREMITY ROM: WFL   UPPER EXTREMITY ZOX:WRUEAVW 4-4+/5    FUNCTIONAL TESTS: None tested at eval   TREATMENT DATE:  08/31/2023 Discussion of symptoms & progress NDI:9/50 18% Cervical Melt Method (flexion; rotation) x 20 each direction  Trigger Point Dry Needling  Subsequent Treatment: Instructions provided previously at initial dry needling treatment.   Patient Verbal Consent Given: Yes Education Handout Provided: Previously Provided Muscles Treated: bilateral suboccipitals; Lt QL Electrical Stimulation Performed: No Treatment Response/Outcome: Utilized skilled palpation to identify trigger points.  During dry needling able to palpate muscle twitch and muscle elongation    08/24/2023 UBE Level 2.0 3/3 6 mins - PT present to discuss status Updated of HEP exercises for periscapular strengthening (see below) Seated bent over Y & T 3# DB 2 x 10 bilateral  3 way scap stabilization with red TB x 8 bilateral  Plank off plinth + hip flexion x 10 90/90 toe tap x 20 Side Planks 2 x 15 sec bilateral  Dead Bug x 20 Supine Chin Tuck x 10 Cervical Rotation SNAG x 8 each direction Review of foam roll      08/17/2023 UBE Level 2.0 3/3 6 mins - PT present to discuss status QL doorways stretch 2 x 30 sec bilateral  Supine hamstring stretch with green strap 2 x 30 sec bilateral  Supine figure four stretch 2 x 30 sec bilateral  Alt knee hand and knee press with red stability ball in  between x 10 90/90 toe tap x 20 Prone  shoulder Y & abduction 3# 2x10 bilateral   Trigger Point Dry Needling  Subsequent Treatment: Instructions provided previously at initial dry needling treatment.  Patient Verbal Consent Given: Yes Education Handout Provided: Previously Provided Muscles Treated: bilateral suboccipitals; Left QL  Electrical Stimulation Performed: No Treatment Response/Outcome: Utilized skilled palpation to identify trigger points.  During dry needling able to palpate muscle twitch and muscle elongation     PATIENT EDUCATION:  Education details: See above; POC; HEP Person educated: Patient Education method: Explanation, Demonstration, and Handouts Education comprehension: verbalized understanding, returned demonstration, and needs further education  HOME EXERCISE PROGRAM: Access Code: [email or web address] URL: [email or web address] Date: 08/24/2023 Prepared by: Penelope Bowie  Exercises - Supine Diaphragmatic Breathing  - 3 x daily - 7 x weekly - 2 sets - 20-30 hold - Supine Hamstring Stretch with Strap  - 3 x daily - 7 x weekly - 2 sets - 20-30 hold - Supine ITB Stretch with Strap  - 3 x daily - 7 x weekly - 2 sets - 20-30 hold - Seated Piriformis Stretch with Trunk Bend  - 3 x daily - 7 x weekly - 2 sets - 20-30 hold - Supine Figure 4 Piriformis Stretch  - 3 x daily - 7 x weekly - 2 sets - 20-30 hold - Standing Quadratus Lumborum Stretch with Doorway  - 1 x daily - 7 x weekly - 2 sets - 20 hold - Child's Pose Stretch  - 1 x daily - 7 x weekly - 1-2 sets - 20 hold - Child's Pose with Sidebending  - 1 x daily - 7 x weekly - 1-2 sets - 20 hold - Supine Alternating Knee Taps with Hands  - 1 x daily - 7 x weekly - 1 sets - 10 reps - 3 hold - Standing Plank on Wall with Reaches and Resistance  - 1 x daily - 7 x weekly - 1 sets - 8 reps - Standing Forward-Bent Shoulder Y With Dumbbells  - 1 x daily - 7 x weekly - 2 sets - 10 reps - Standing Forward-Bent Horizontal  Abduction With Dumbbells  - 1 x daily - 7 x weekly - 2 sets - 10 reps - [employer] on Counter  - 1 x daily - 7 x weekly - 1-2 sets - 10 reps - Side Plank on Knees  - 1 x daily - 7 x weekly - 1-2 sets - 15 sec hold - Supine 90/90 Alternating Toe Touch  - 1 x daily - 7 x weekly - 2 sets - 10 reps - Supine Dead Bug with Leg Extension  - 1 x daily - 7 x weekly - 3 sets - 10 reps - Supine Cervical Retraction with Towel  - 1 x daily - 7 x weekly - 1-2 sets - 10 reps - Seated Cervical Retraction  - 1 x daily - 7 x weekly - 1-2 sets - 10 reps - Seated Assisted Cervical Rotation with Towel  - 1 x daily - 7 x weekly - 1 sets - 10 reps Proper standing alignment at desk  ASSESSMENT:  CLINICAL IMPRESSION: Re-certification completed today. Leroy verbalize feeling 80-90 % better since starting therapy. He had a little bit more pain today due to having a more stressful week and not patient close attention to his seated posture. Provided patient education and discussion on quality of life and stress management. Discussed motivators, challenges, and goals for future. He has had more sciatic nerve pain recently due to not being compliant with  HEP. Dry needling was successful at eliciting muscle twitch and elongation. Patient would benefit from continued therapy to address remaining goals, solidify HEP and pain management strategies.    OBJECTIVE IMPAIRMENTS: decreased ROM, decreased strength, increased fascial restrictions, increased muscle spasms, impaired flexibility, improper body mechanics, postural dysfunction, and pain.   ACTIVITY LIMITATIONS: lifting, sitting, standing, sleeping, and reach over head  PARTICIPATION LIMITATIONS: interpersonal relationship, driving, community activity, and occupation  PERSONAL FACTORS: Profession, Time since onset of injury/illness/exacerbation, and 1-2 comorbidities: depression; anxiety are also affecting patient's functional outcome.   REHAB POTENTIAL:  Good  CLINICAL DECISION MAKING: Evolving/moderate complexity  EVALUATION COMPLEXITY: Moderate   GOALS: Goals reviewed with patient? Yes  SHORT TERM GOALS: Target date: 08/01/2023  Patient will be independent with initial HEP. Baseline:  Goal status: MET 08/08/2023  2.  Patient will report > or = to 30% improvement in neck/ back  pain since starting PT. Baseline:  Goal status:MET 08/08/2023   3.  Patient will demonstrate correct seated and standing posture for proper cervical alignment. Baseline:  Goal status: Met 08/31/2023  LONG TERM GOALS: Target date: 10/12/2023   Patient will demonstrate independence in advanced HEP. Baseline:  Goal status: IN PROGRESS 08/31/2023  2.  Patient will report > or = to 70% improvement in neck/ back pain since starting PT. Baseline:  Goal status: MET 08/31/2023  3.  Patient will verbalize and demonstrate self-care strategies to manage pain including tissue mobility practices and change of position. Baseline:  Goal status: IN PROGRESS 08/31/2023   4.  Patient will be able to complete work day with < or = to 2/10 pain.  Baseline: he has to stop due to pain (3/10 pain at best) Goal status: IN PROGRESS 08/31/2023     PLAN:  PT FREQUENCY: every other week starting week of 6/23  PT DURATION: 6 weeks  PLANNED INTERVENTIONS: 97164- PT Re-evaluation, 97110-Therapeutic exercises, 97530- Therapeutic activity, 97112- Neuromuscular re-education, 97535- Self Care, [id number]- Manual therapy, [phone]- Canalith repositioning, [id number]- Aquatic Therapy, G0283- Electrical stimulation (unattended), [id number]- Electrical stimulation (manual), 97016- Vasopneumatic device, [id number]- Ultrasound, [id number]- Traction (mechanical), [id number]- Ionotophoresis 4mg /ml Dexamethasone, Balance training, Stair training, Taping, Dry Needling, Joint mobilization, Joint manipulation, Spinal manipulation, Spinal mobilization, Manual lymph drainage, Scar mobilization, Vestibular training, Cryotherapy,  and Moist heat  PLAN FOR NEXT SESSION: assess response to DN; review HEP; cervical mobility; postural strengthening   Penelope Bowie, PT 08/31/23 12:05 PM [facility] [address], [address] [place], [place] [address] Phone # [phone] Fax [phone]

## 2023-09-05 ENCOUNTER — Encounter: Admitting: Physical Therapy

## 2023-09-07 ENCOUNTER — Ambulatory Visit: Admitting: Physical Therapy

## 2023-09-12 ENCOUNTER — Encounter: Payer: Self-pay | Admitting: Physical Therapy

## 2023-09-12 ENCOUNTER — Ambulatory Visit: Payer: Self-pay | Admitting: Physical Therapy

## 2023-09-12 DIAGNOSIS — M545 Low back pain, unspecified: Secondary | ICD-10-CM | POA: Diagnosis not present

## 2023-09-12 DIAGNOSIS — R252 Cramp and spasm: Secondary | ICD-10-CM

## 2023-09-12 DIAGNOSIS — R293 Abnormal posture: Secondary | ICD-10-CM | POA: Diagnosis not present

## 2023-09-12 DIAGNOSIS — M542 Cervicalgia: Secondary | ICD-10-CM | POA: Diagnosis not present

## 2023-09-12 DIAGNOSIS — G8929 Other chronic pain: Secondary | ICD-10-CM | POA: Diagnosis not present

## 2023-09-12 DIAGNOSIS — M5459 Other low back pain: Secondary | ICD-10-CM | POA: Diagnosis not present

## 2023-09-12 NOTE — Therapy (Signed)
 OUTPATIENT PHYSICAL THERAPY CERVICAL TREATMENT   Patient Name: Kevin Lane MRN: [id number] DOB:2000/04/08, 23 y.o., male Today's Date: 09/12/2023  END OF SESSION:  PT End of Session - 09/12/23 1032     Visit Number 9    Date for PT Re-Evaluation 10/12/23    Authorization Type [facility] [employer]    PT Start Time 0930    PT Stop Time 1030    PT Time Calculation (min) 60 min    Activity Tolerance Patient tolerated treatment well    Behavior During Therapy [facility] for tasks assessed/performed                  Past Medical History:  Diagnosis Date   Allergy    Depression with anxiety April 2015   History reviewed. No pertinent surgical history. 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   Irritation of right ulnar nerve 12/23/2018   Encounter for routine child health examination without abnormal findings 10/24/2016   BMI (body mass index), pediatric, 5% to less than 85% for age 70/09/2013   Depression with anxiety 06/24/2013   Inflammatory acne 06/24/2013    PCP: None  REFERRING PROVIDER: Cleatrice Ludie SAUNDERS, MD  REFERRING DIAG: M54.2 (ICD-10-CM) - Neck pain  THERAPY DIAG:  Cervicalgia  Other low back pain  Abnormal posture  Cramp and spasm  Rationale for Evaluation and Treatment: Rehabilitation  ONSET DATE: 05/30/2023  SUBJECTIVE:                                                                                                                                                                                                         SUBJECTIVE STATEMENT: Patient reports his neck is very stiff today. He has been doing doordash and he has noticed increased tightness in his piriformis. He felt good after dry needling last session.   From Eval: Patient presents with neck pain at the base of skull that started 4-5 weeks ago.  He feels he has been tensing a lot from work and other stressors. He feels he has lost some ROM since the flare up. 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 He has been to [facility]SR in the past and he feels he got his back stronger and he felt better. He still does his exercises and if he is not consistent the pain gets  worse. He has had occasionally dull pains radiating down his arms. With his current job he has long work days and he has developed sciatic nerve pain on his left side. He attributes this to his bulging disk and piriformis muscle. He got a standing desk for work and that has helped his pain. He takes a break at work every 15 minutes. Currently sciatic nerve pain is worse than neck pain.  His current exercise program includes: prone on forearms; full cobra; bird dogs; trunk rotation strengthening; iso shoulder abduction + flexion; shoulder abduction; Y's at wall; shoulder row Hand dominance: Right  PERTINENT HISTORY:  Depression; anxiety; hx of cervical and lumbar pain  PAIN:  Are you having pain? Yes: NPRS scale: 3 (currently) 10(worst) Pain location: base of skull; left side lumbar ; bilateral upper trap Pain description: tight; dull; sore. Feels like someone poured lava down arms Aggravating factors: sitting for work;  Relieving factors: salonpas; tiger balm; laying down  PRECAUTIONS: None  RED FLAGS: None     WEIGHT BEARING RESTRICTIONS: No  FALLS:  Has patient fallen in last 6 months? 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  DIAGNOSTIC FINDINGS:  None  PATIENT SURVEYS:  NDI 22/50 44%    08/31/2023 NDI 9/50 18%    COGNITION: Overall cognitive status: Within functional limits for tasks assessed  SENSATION: Radiating pain down bilateral arms and   POSTURE: rounded shoulders and forward head  PALPATION: Increased muscle spasms bilateral upper trap & suboccipitals   CERVICAL ROM: WFL patient feels tightness end range cervical rotation    UPPER EXTREMITY ROM: WFL   UPPER EXTREMITY FFU:Hmnddob 4-4+/5    FUNCTIONAL TESTS: None tested at eval   TREATMENT DATE:  09/12/2023 Supine figure four with knee to chest 2 x 30 bilateral  Cervical Melt Method (flexion; rotation) x 20 each direction Supine hamstring stretch with green strap 2 x 30 sec bilateral  Supine TFL stretch with green strap 2 x 30 sec bilateral  Cervical Melt Method (flexion; rotation) x 20 each direction Open Books x 10 each direction Thread the Needle x 5 each side Childs Pose 2 x 30 sec  Discussed patient's breathing and stress management Manual: suboccipital release & upper trap stretch ; Lt QL soft tissue release and left lumbar paraspinal soft tissue massage   08/31/2023 Discussion of symptoms & progress NDI:9/50 18% Cervical Melt Method (flexion; rotation) x 20 each direction  Trigger Point Dry Needling  Subsequent Treatment: Instructions provided previously at initial dry needling treatment.   Patient Verbal Consent Given: Yes Education Handout Provided: Previously Provided Muscles Treated: bilateral suboccipitals; Lt QL Electrical Stimulation Performed: No Treatment Response/Outcome: Utilized skilled palpation to identify trigger points.  During dry needling able to palpate muscle twitch and muscle elongation    08/24/2023 UBE Level 2.0 3/3 6 mins - PT present to discuss status Updated of HEP exercises for  periscapular strengthening (see below) Seated bent over Y & T 3# DB 2 x 10 bilateral  3 way scap stabilization with red TB x 8 bilateral  Plank off plinth +  hip flexion x 10 90/90 toe tap x 20 Side Planks 2 x 15 sec bilateral  Dead Bug x 20 Supine Chin Tuck x 10 Cervical Rotation SNAG x 8 each direction Review of foam roll   PATIENT EDUCATION:  Education details: See above; POC; HEP Person educated: Patient Education method: Explanation, Demonstration, and Handouts Education comprehension: verbalized understanding, returned demonstration, and needs further education  HOME EXERCISE PROGRAM: Access Code: [email or web address] URL: [email or web address] Date: 09/12/2023 Prepared by: Kristeen Sar  Exercises - Supine Diaphragmatic Breathing  - 3 x daily - 7 x weekly - 2 sets - 20-30 hold - Supine Hamstring Stretch with Strap  - 3 x daily - 7 x weekly - 2 sets - 20-30 hold - Supine ITB Stretch with Strap  - 3 x daily - 7 x weekly - 2 sets - 20-30 hold - Seated Piriformis Stretch with Trunk Bend  - 3 x daily - 7 x weekly - 2 sets - 20-30 hold - Supine Figure 4 Piriformis Stretch  - 3 x daily - 7 x weekly - 2 sets - 20-30 hold - Standing Quadratus Lumborum Stretch with Doorway  - 1 x daily - 7 x weekly - 2 sets - 20 hold - Child's Pose Stretch  - 1 x daily - 7 x weekly - 1-2 sets - 20 hold - Child's Pose with Sidebending  - 1 x daily - 7 x weekly - 1-2 sets - 20 hold - Supine Alternating Knee Taps with Hands  - 1 x daily - 7 x weekly - 1 sets - 10 reps - 3 hold - Standing Plank on Wall with Reaches and Resistance  - 1 x daily - 7 x weekly - 1 sets - 8 reps - Standing Forward-Bent Shoulder Y With Dumbbells  - 1 x daily - 7 x weekly - 2 sets - 10 reps - Standing Forward-Bent Horizontal Abduction With Dumbbells  - 1 x daily - 7 x weekly - 2 sets - 10 reps - [employer] on Counter  - 1 x daily - 7 x weekly - 1-2 sets - 10 reps - Side Plank on Knees  - 1 x daily - 7 x weekly - 1-2 sets  - 15 sec hold - Supine 90/90 Alternating Toe Touch  - 1 x daily - 7 x weekly - 2 sets - 10 reps - Supine Dead Bug with Leg Extension  - 1 x daily - 7 x weekly - 3 sets - 10 reps - Supine Cervical Retraction with Towel  - 1 x daily - 7 x weekly - 1-2 sets - 10 reps - Seated Cervical Retraction  - 1 x daily - 7 x weekly - 1-2 sets - 10 reps - Seated Assisted Cervical Rotation with Towel  - 1 x daily - 7 x weekly - 1 sets - 10 reps - Supine 90/90 Sciatic Nerve Glide with Knee Flexion/Extension  - 1 x daily - 7 x weekly - 1 sets - 10 reps  ASSESSMENT:  CLINICAL IMPRESSION: Maurisio present with increased neck stiffness today. He has started doing doordash and he has noticed increased sciatic nerve symptoms. Educated patient on stretches and exercises he can perform in between orders. Incorporated sciatic nerve glides today. PT monitored patient throughout and provided verbal and visual cues as needed. Patient verbalized relief after manual soft tissue techniques. Patient will benefit from skilled PT to address the below impairments and improve overall function.    OBJECTIVE IMPAIRMENTS: decreased ROM, decreased strength, increased  fascial restrictions, increased muscle spasms, impaired flexibility, improper body mechanics, postural dysfunction, and pain.   ACTIVITY LIMITATIONS: lifting, sitting, standing, sleeping, and reach over head  PARTICIPATION LIMITATIONS: interpersonal relationship, driving, community activity, and occupation  PERSONAL FACTORS: Profession, Time since onset of injury/illness/exacerbation, and 1-2 comorbidities: depression; anxiety are also affecting patient's functional outcome.   REHAB POTENTIAL: Good  CLINICAL DECISION MAKING: Evolving/moderate complexity  EVALUATION COMPLEXITY: Moderate   GOALS: Goals reviewed with patient? Yes  SHORT TERM GOALS: Target date: 08/01/2023  Patient will be independent with initial HEP. Baseline:  Goal status: MET 08/08/2023  2.   Patient will report > or = to 30% improvement in neck/ back  pain since starting PT. Baseline:  Goal status:MET 08/08/2023   3.  Patient will demonstrate correct seated and standing posture for proper cervical alignment. Baseline:  Goal status: Met 08/31/2023  LONG TERM GOALS: Target date: 10/12/2023   Patient will demonstrate independence in advanced HEP. Baseline:  Goal status: IN PROGRESS 08/31/2023  2.  Patient will report > or = to 70% improvement in neck/ back pain since starting PT. Baseline:  Goal status: MET 08/31/2023  3.  Patient will verbalize and demonstrate self-care strategies to manage pain including tissue mobility practices and change of position. Baseline:  Goal status: IN PROGRESS 08/31/2023   4.  Patient will be able to complete work day with < or = to 2/10 pain.  Baseline: he has to stop due to pain (3/10 pain at best) Goal status: IN PROGRESS 08/31/2023     PLAN:  PT FREQUENCY: every other week starting week of 6/23  PT DURATION: 6 weeks  PLANNED INTERVENTIONS: 97164- PT Re-evaluation, 97110-Therapeutic exercises, 97530- Therapeutic activity, 97112- Neuromuscular re-education, 97535- Self Care, 02859- Manual therapy, [phone]- Canalith repositioning, [id number]- Aquatic Therapy, G0283- Electrical stimulation (unattended), [phone]- Electrical stimulation (manual), 97016- Vasopneumatic device, [id number]- Ultrasound, [id number]- Traction (mechanical), [id number]- Ionotophoresis 4mg /ml Dexamethasone, Balance training, Stair training, Taping, Dry Needling, Joint mobilization, Joint manipulation, Spinal manipulation, Spinal mobilization, Manual lymph drainage, Scar mobilization, Vestibular training, Cryotherapy, and Moist heat  PLAN FOR NEXT SESSION: assess response to treatment session; ; cervical mobility; postural strengthening   Kristeen Sar, PT 09/12/23 10:34 AM [facility] [address], [address] [place], [place] [address] Phone # [phone] Fax  [phone]

## 2023-09-27 ENCOUNTER — Encounter: Payer: Self-pay | Admitting: Physical Therapy

## 2023-09-27 ENCOUNTER — Ambulatory Visit: Payer: Self-pay | Attending: Family Medicine | Admitting: Physical Therapy

## 2023-09-27 DIAGNOSIS — R252 Cramp and spasm: Secondary | ICD-10-CM | POA: Insufficient documentation

## 2023-09-27 DIAGNOSIS — M5459 Other low back pain: Secondary | ICD-10-CM | POA: Insufficient documentation

## 2023-09-27 DIAGNOSIS — R293 Abnormal posture: Secondary | ICD-10-CM | POA: Diagnosis not present

## 2023-09-27 DIAGNOSIS — M542 Cervicalgia: Secondary | ICD-10-CM | POA: Diagnosis not present

## 2023-09-27 NOTE — Therapy (Addendum)
 OUTPATIENT PHYSICAL THERAPY CERVICAL TREATMENT / DISCHARGE NOTE  Patient Name: Kevin Lane MRN: [id number] DOB:03/12/2001, 23 y.o., male Today's Date: 09/27/2023  END OF SESSION:  PT End of Session - 09/27/23 1010     Visit Number 10    Date for PT Re-Evaluation 10/12/23    Authorization Type [facility] [employer]    PT Start Time [phone]    PT Stop Time 0931    PT Time Calculation (min) 44 min    Activity Tolerance Patient tolerated treatment well    Behavior During Therapy [facility] for tasks assessed/performed                   Past Medical History:  Diagnosis Date   Allergy    Depression with anxiety April 2015   History reviewed. No pertinent surgical history. 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   Irritation of right ulnar nerve 12/23/2018   Encounter for routine child health examination without abnormal findings 10/24/2016   BMI (body mass index), pediatric, 5% to less than 85% for age 70/09/2013   Depression with anxiety 06/24/2013   Inflammatory acne 06/24/2013    PCP: None  REFERRING PROVIDER: Cleatrice Ludie SAUNDERS, MD  REFERRING DIAG: M54.2 (ICD-10-CM) - Neck pain  THERAPY DIAG:  Cervicalgia  Other low back pain  Abnormal posture  Cramp and spasm  Rationale for Evaluation and Treatment: Rehabilitation  ONSET DATE: 05/30/2023  SUBJECTIVE:                                                                                                                                                                                                         SUBJECTIVE STATEMENT: Patient reports increased sciatic nerve pain over the last week. He has been trying to do his stretches. Pain is 4-5/10.   From Eval: Patient presents with neck pain at the base of skull that started 4-5 weeks ago. He feels he has been tensing a lot  from work and other stressors. He feels he has lost some ROM since the flare up. 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 He has been to [facility] in the past and he feels he got his back stronger and he felt better. He still does his exercises and if he is not consistent the pain gets worse. He has had occasionally dull  pains radiating down his arms. With his current job he has long work days and he has developed sciatic nerve pain on his left side. He attributes this to his bulging disk and piriformis muscle. He got a standing desk for work and that has helped his pain. He takes a break at work every 15 minutes. Currently sciatic nerve pain is worse than neck pain.  His current exercise program includes: prone on forearms; full cobra; bird dogs; trunk rotation strengthening; iso shoulder abduction + flexion; shoulder abduction; Y's at wall; shoulder row Hand dominance: Right  PERTINENT HISTORY:  Depression; anxiety; hx of cervical and lumbar pain  PAIN:  Are you having pain? Yes: NPRS scale: 3 (currently) 10(worst) Pain location: base of skull; left side lumbar ; bilateral upper trap Pain description: tight; dull; sore. Feels like someone poured lava down arms Aggravating factors: sitting for work;  Relieving factors: salonpas; tiger balm; laying down  PRECAUTIONS: None  RED FLAGS: None     WEIGHT BEARING RESTRICTIONS: No  FALLS:  Has patient fallen in last 6 months? 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  DIAGNOSTIC FINDINGS:  None  PATIENT SURVEYS:  NDI 22/50 44%    08/31/2023 NDI 9/50 18%    COGNITION: Overall cognitive status: Within functional limits for tasks assessed  SENSATION: Radiating pain down bilateral arms and   POSTURE: rounded shoulders and forward head  PALPATION: Increased muscle spasms bilateral upper trap & suboccipitals   CERVICAL ROM: WFL patient feels tightness end range cervical rotation    UPPER EXTREMITY ROM: WFL   UPPER EXTREMITY FFU:Hmnddob 4-4+/5    FUNCTIONAL TESTS: None tested at eval   TREATMENT DATE:  09/27/2023 UBE 3/3 Level 3- PT present to discuss status Supine figure four with knee to chest 2 x 30 bilateral  Supine hamstring stretch with green strap 2 x 30 sec bilateral  Supine TFL stretch with green strap 2 x 30 sec bilateral  Seated bent over shoulder T and Y 3# DB x 10 each Manual: suboccipital release ; Lt QL soft tissue release and left lumbar paraspinal soft tissue massage     09/12/2023 Supine figure four with knee to chest 2 x 30 bilateral  Cervical Melt Method (flexion; rotation) x 20 each direction Supine hamstring stretch with green strap 2 x 30 sec bilateral  Supine TFL stretch with green strap 2 x 30 sec bilateral  Cervical Melt Method (flexion; rotation) x 20 each direction Open Books x 10 each direction Thread the Needle x 5 each side Childs Pose 2 x 30 sec  Discussed patient's breathing and stress management Manual: suboccipital release & upper trap stretch ; Lt QL soft tissue release and left lumbar paraspinal soft tissue massage   08/31/2023 Discussion of symptoms & progress NDI:9/50 18% Cervical Melt Method (flexion; rotation) x 20 each direction  Trigger Point Dry Needling  Subsequent Treatment: Instructions provided previously at initial dry needling treatment.   Patient Verbal Consent Given: Yes Education Handout Provided: Previously  Provided Muscles Treated: bilateral suboccipitals; Lt QL Electrical Stimulation Performed: No Treatment  Response/Outcome: Utilized skilled palpation to identify trigger points.  During dry needling able to palpate muscle twitch and muscle elongation    08/24/2023 UBE Level 2.0 3/3 6 mins - PT present to discuss status Updated of HEP exercises for periscapular strengthening (see below) Seated bent over Y & T 3# DB 2 x 10 bilateral  3 way scap stabilization with red TB x 8 bilateral  Plank off plinth + hip flexion x 10 90/90 toe tap x 20 Side Planks 2 x 15 sec bilateral  Dead Bug x 20 Supine Chin Tuck x 10 Cervical Rotation SNAG x 8 each direction Review of foam roll   PATIENT EDUCATION:  Education details: See above; POC; HEP Person educated: Patient Education method: Explanation, Demonstration, and Handouts Education comprehension: verbalized understanding, returned demonstration, and needs further education  HOME EXERCISE PROGRAM: Access Code: [email or web address] URL: [email or web address] Date: 09/12/2023 Prepared by: Kristeen Sar  Exercises - Supine Diaphragmatic Breathing  - 3 x daily - 7 x weekly - 2 sets - 20-30 hold - Supine Hamstring Stretch with Strap  - 3 x daily - 7 x weekly - 2 sets - 20-30 hold - Supine ITB Stretch with Strap  - 3 x daily - 7 x weekly - 2 sets - 20-30 hold - Seated Piriformis Stretch with Trunk Bend  - 3 x daily - 7 x weekly - 2 sets - 20-30 hold - Supine Figure 4 Piriformis Stretch  - 3 x daily - 7 x weekly - 2 sets - 20-30 hold - Standing Quadratus Lumborum Stretch with Doorway  - 1 x daily - 7 x weekly - 2 sets - 20 hold - Child's Pose Stretch  - 1 x daily - 7 x weekly - 1-2 sets - 20 hold - Child's Pose with Sidebending  - 1 x daily - 7 x weekly - 1-2 sets - 20 hold - Supine Alternating Knee Taps with Hands  - 1 x daily - 7 x weekly - 1 sets - 10 reps - 3 hold - Standing Plank on Wall with Reaches and Resistance  - 1 x daily - 7 x weekly - 1  sets - 8 reps - Standing Forward-Bent Shoulder Y With Dumbbells  - 1 x daily - 7 x weekly - 2 sets - 10 reps - Standing Forward-Bent Horizontal Abduction With Dumbbells  - 1 x daily - 7 x weekly - 2 sets - 10 reps - [employer] on Counter  - 1 x daily - 7 x weekly - 1-2 sets - 10 reps - Side Plank on Knees  - 1 x daily - 7 x weekly - 1-2 sets - 15 sec hold - Supine 90/90 Alternating Toe Touch  - 1 x daily - 7 x weekly - 2 sets - 10 reps - Supine Dead Bug with Leg Extension  - 1 x daily - 7 x weekly - 3 sets - 10 reps - Supine Cervical Retraction with Towel  - 1 x daily - 7 x weekly - 1-2 sets - 10 reps - Seated Cervical Retraction  - 1 x daily - 7 x weekly - 1-2 sets - 10 reps - Seated Assisted Cervical Rotation with Towel  - 1 x daily - 7 x weekly - 1 sets - 10 reps - Supine 90/90 Sciatic Nerve Glide with Knee Flexion/Extension  - 1 x daily - 7 x weekly - 1 sets - 10 reps  ASSESSMENT:  CLINICAL IMPRESSION: Kalob presents with increased sciatic nerve pain. He has  been trying to incorporate flexibility exercises. Reviewed shoulder postural strengthening exercises and provided tactile and visual cues for proper form. Patient responded well to manual techniques targeting suboccipitals and left QL. Patient would benefit from continued therapy to address muscle spasms, pain management, and postural strengthening.    OBJECTIVE IMPAIRMENTS: decreased ROM, decreased strength, increased fascial restrictions, increased muscle spasms, impaired flexibility, improper body mechanics, postural dysfunction, and pain.   ACTIVITY LIMITATIONS: lifting, sitting, standing, sleeping, and reach over head  PARTICIPATION LIMITATIONS: interpersonal relationship, driving, community activity, and occupation  PERSONAL FACTORS: Profession, Time since onset of injury/illness/exacerbation, and 1-2 comorbidities: depression; anxiety are also affecting patient's functional outcome.   REHAB POTENTIAL:  Good  CLINICAL DECISION MAKING: Evolving/moderate complexity  EVALUATION COMPLEXITY: Moderate   GOALS: Goals reviewed with patient? Yes  SHORT TERM GOALS: Target date: 08/01/2023  Patient will be independent with initial HEP. Baseline:  Goal status: MET 08/08/2023  2.  Patient will report > or = to 30% improvement in neck/ back  pain since starting PT. Baseline:  Goal status:MET 08/08/2023   3.  Patient will demonstrate correct seated and standing posture for proper cervical alignment. Baseline:  Goal status: Met 08/31/2023  LONG TERM GOALS: Target date: 10/12/2023   Patient will demonstrate independence in advanced HEP. Baseline:  Goal status: met 10/10/2023  2.  Patient will report > or = to 70% improvement in neck/ back pain since starting PT. Baseline:  Goal status: MET 08/31/2023  3.  Patient will verbalize and demonstrate self-care strategies to manage pain including tissue mobility practices and change of position. Baseline:  Goal status: met 10/10/2023   4.  Patient will be able to complete work day with < or = to 2/10 pain.  Baseline: he has to stop due to pain (3/10 pain at best) Goal status: met 10/10/2023     PLAN:  PT FREQUENCY: every other week starting week of 6/23  PT DURATION: 6 weeks  PLANNED INTERVENTIONS: 97164- PT Re-evaluation, 97110-Therapeutic exercises, 97530- Therapeutic activity, 97112- Neuromuscular re-education, 97535- Self Care, 02859- Manual therapy, [phone]- Canalith repositioning, [id number]- Aquatic Therapy, G0283- Electrical stimulation (unattended), [phone]- Electrical stimulation (manual), 97016- Vasopneumatic device, [id number]- Ultrasound, [id number]- Traction (mechanical), [id number]- Ionotophoresis 4mg /ml Dexamethasone, Balance training, Stair training, Taping, Dry Needling, Joint mobilization, Joint manipulation, Spinal manipulation, Spinal mobilization, Manual lymph drainage, Scar mobilization, Vestibular training, Cryotherapy, and Moist heat  PLAN  FOR NEXT SESSION: possible recert next visit ; cervical mobility; postural strengthening   Kristeen Sar, PT 09/27/23 10:13 AM [facility] [address] [place], [place] [address] Phone # [phone] Fax [phone]   PHYSICAL THERAPY DISCHARGE SUMMARY  Visits from Start of Care: 10  Current functional level related to goals / functional outcomes: All goals met. Patient discharging due to being pleased with functional level.    Remaining deficits: Occasional pain based on work hours.    Education / Equipment: See above   Patient agrees to discharge. Patient goals were met. Patient is being discharged due to being pleased with the current functional level.   Kristeen Sar, PT 10/10/23 9:02 AM

## 2023-10-10 ENCOUNTER — Ambulatory Visit: Payer: Self-pay | Admitting: Physical Therapy

## 2023-11-07 DIAGNOSIS — M545 Low back pain, unspecified: Secondary | ICD-10-CM | POA: Diagnosis not present

## 2023-11-07 DIAGNOSIS — M549 Dorsalgia, unspecified: Secondary | ICD-10-CM | POA: Diagnosis not present

## 2023-12-31 ENCOUNTER — Ambulatory Visit: Admitting: Podiatry

## 2023-12-31 ENCOUNTER — Encounter: Payer: Self-pay | Admitting: Podiatry

## 2023-12-31 DIAGNOSIS — L600 Ingrowing nail: Secondary | ICD-10-CM

## 2023-12-31 NOTE — Progress Notes (Signed)
 Subjective:   Patient ID: Kevin Lane, male   DOB: 23 y.o.   MRN: [id number]   HPI Patient presents with chronic ingrown toenail deformities bilateral that are very tender and make shoe gear difficult.  Points to the medial border of the hallux bilateral and states has been this way for a long time.  Patient does not smoke likes to be active   Review of Systems  All other systems reviewed and are negative.       Objective:  Physical Exam Vitals and nursing note reviewed.  Constitutional:      Appearance: He is well-developed.  Pulmonary:     Effort: Pulmonary effort is normal.  Musculoskeletal:        General: Normal range of motion.  Skin:    General: Skin is warm.  Neurological:     Mental Status: He is alert.     Neurovascular status intact muscle strength adequate range of motion within normal limits with the patient found to have incurvation of the medial border of the hallux bilateral that are painful when pressed no active drainage slight redness noted within the nailbed.  Good digital perfusion well-oriented x 3     Assessment:  Ingrown toenail deformity hallux bilateral medial borders with pain     Plan:  H&P reviewed and recommended correction of deformity.  Explained procedures risk patient wants surgery and today I allowed him to read then signed consent form.  I infiltrated each big toe 60 mg Xylocaine Marcaine mixture sterile prep done using sterile instrumentation removed the medial border exposed matrix applied phenol 3 applications 30 seconds followed by alcohol  lavage sterile dressing gave instructions on soaks wear dressings 24 hours take them off earlier if throbbing were to occur and encouraged to call with questions concerns which may arise

## 2023-12-31 NOTE — Patient Instructions (Signed)

## 2024-02-13 DIAGNOSIS — M25562 Pain in left knee: Secondary | ICD-10-CM | POA: Diagnosis not present

## 2024-02-27 DIAGNOSIS — S83502A Sprain of unspecified cruciate ligament of left knee, initial encounter: Secondary | ICD-10-CM | POA: Diagnosis not present

## 2024-02-27 DIAGNOSIS — M25362 Other instability, left knee: Secondary | ICD-10-CM | POA: Diagnosis not present

## 2024-02-29 ENCOUNTER — Other Ambulatory Visit (HOSPITAL_BASED_OUTPATIENT_CLINIC_OR_DEPARTMENT_OTHER): Payer: Self-pay | Admitting: Family Medicine

## 2024-02-29 DIAGNOSIS — M25562 Pain in left knee: Secondary | ICD-10-CM

## 2024-03-04 ENCOUNTER — Ambulatory Visit (HOSPITAL_COMMUNITY): Admission: RE | Admit: 2024-03-04 | Discharge: 2024-03-04 | Attending: Family Medicine

## 2024-03-04 DIAGNOSIS — S83512A Sprain of anterior cruciate ligament of left knee, initial encounter: Secondary | ICD-10-CM | POA: Diagnosis not present

## 2024-03-04 DIAGNOSIS — M25562 Pain in left knee: Secondary | ICD-10-CM

## 2024-03-04 DIAGNOSIS — M25462 Effusion, left knee: Secondary | ICD-10-CM | POA: Diagnosis not present

## 2024-03-04 DIAGNOSIS — S8002XA Contusion of left knee, initial encounter: Secondary | ICD-10-CM | POA: Diagnosis not present

## 2024-03-04 DIAGNOSIS — M660 Rupture of popliteal cyst: Secondary | ICD-10-CM | POA: Diagnosis not present

## 2024-03-06 DIAGNOSIS — S83242A Other tear of medial meniscus, current injury, left knee, initial encounter: Secondary | ICD-10-CM | POA: Diagnosis not present

## 2024-03-06 DIAGNOSIS — S83512A Sprain of anterior cruciate ligament of left knee, initial encounter: Secondary | ICD-10-CM | POA: Diagnosis not present

## 2024-03-06 DIAGNOSIS — S83282A Other tear of lateral meniscus, current injury, left knee, initial encounter: Secondary | ICD-10-CM | POA: Diagnosis not present

## 2024-04-23 ENCOUNTER — Encounter (HOSPITAL_BASED_OUTPATIENT_CLINIC_OR_DEPARTMENT_OTHER): Payer: Self-pay

## 2024-04-23 ENCOUNTER — Other Ambulatory Visit: Payer: Self-pay

## 2024-05-01 ENCOUNTER — Ambulatory Visit (HOSPITAL_BASED_OUTPATIENT_CLINIC_OR_DEPARTMENT_OTHER): Admit: 2024-05-01

## 2024-05-01 ENCOUNTER — Encounter (HOSPITAL_BASED_OUTPATIENT_CLINIC_OR_DEPARTMENT_OTHER): Payer: Self-pay

## 2024-05-01 SURGERY — KNEE ARTHROSCOPY WITH ANTERIOR CRUCIATE LIGAMENT WITH BONE-TENDON-BONE GRAFT
Anesthesia: General | Site: Knee | Laterality: Left
